# Patient Record
Sex: Female | Born: 1995 | Race: White | Hispanic: No | Marital: Single | Smoking: Never smoker
Health system: Southern US, Community
[De-identification: ages and names within clinical notes are randomized; demographics above are authoritative.]

---
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
## 2016-04-24 ENCOUNTER — Encounter: Payer: Self-pay | Admitting: Emergency Medicine

## 2016-04-24 DIAGNOSIS — J020 Streptococcal pharyngitis: Secondary | ICD-10-CM | POA: Diagnosis not present

## 2016-04-24 DIAGNOSIS — J029 Acute pharyngitis, unspecified: Secondary | ICD-10-CM | POA: Diagnosis present

## 2016-04-24 MED ORDER — ACETAMINOPHEN 325 MG PO TABS
ORAL_TABLET | ORAL | Status: AC
  Filled 2016-04-24: qty 2

## 2016-04-24 MED ORDER — ACETAMINOPHEN 325 MG PO TABS
650.0000 mg | ORAL_TABLET | Freq: Once | ORAL | Status: AC
  Administered 2016-04-24: 650 mg via ORAL

## 2016-04-24 NOTE — ED Triage Notes (Addendum)
Pt presents to ED with sore throat, headache, generalized body aches, fever, and vomiting X1 since Monday. Seen by campus health clinic and was tested for mono and flu; both tests were negative. Seen by urgent care today and tested for strep. antibiotics provided to tx for bacterial infection as well as for possible strep even though strep test was negative. Pt alert and tearful. States that her throat hurts so badly that it feels like "it is closing in". Fever improved today and headache has worsened. Pt reports she has not been able to eat much and thinks she may be dehydrated.

## 2016-04-25 ENCOUNTER — Emergency Department
Admission: EM | Admit: 2016-04-25 | Discharge: 2016-04-25 | Disposition: A | Discharge status: Home / Self Care | Attending: Emergency Medicine | Admitting: Emergency Medicine

## 2016-04-25 DIAGNOSIS — J020 Streptococcal pharyngitis: Secondary | ICD-10-CM

## 2016-04-25 MED ORDER — DEXAMETHASONE SODIUM PHOSPHATE 10 MG/ML IJ SOLN
10.0000 mg | Freq: Once | INTRAMUSCULAR | Status: AC
  Administered 2016-04-25: 10 mg via INTRAVENOUS
  Filled 2016-04-25: qty 1

## 2016-04-25 MED ORDER — IBUPROFEN 100 MG/5ML PO SUSP
ORAL | Status: AC
  Administered 2016-04-25: 600 mg via ORAL
  Filled 2016-04-25: qty 30

## 2016-04-25 MED ORDER — MECLIZINE HCL 25 MG PO TABS
ORAL_TABLET | ORAL | Status: AC
  Filled 2016-04-25: qty 1

## 2016-04-25 MED ORDER — LIDOCAINE VISCOUS 2 % MT SOLN
30.0000 mL | Freq: Once | OROMUCOSAL | Status: AC
  Administered 2016-04-25: 30 mL via OROMUCOSAL
  Filled 2016-04-25: qty 30

## 2016-04-25 MED ORDER — SODIUM CHLORIDE 0.9 % IV BOLUS (SEPSIS)
1000.0000 mL | Freq: Once | INTRAVENOUS | Status: AC
  Administered 2016-04-25: 1000 mL via INTRAVENOUS

## 2016-04-25 MED ORDER — IBUPROFEN 100 MG/5ML PO SUSP
600.0000 mg | Freq: Once | ORAL | Status: AC
  Administered 2016-04-25: 600 mg via ORAL

## 2016-04-25 NOTE — Discharge Instructions (Addendum)
The normal course of your illnesses to be sick for about 4-5 days. Please make sure you remain well-hydrated. Does not important to have food but it is very important to make sure that you have water and other liquids.  It was a pleasure to take care of you today, and thank you for coming to our emergency department.  If you have any questions or concerns before leaving please ask the nurse to grab me and I'm more than happy to go through your aftercare instructions again.  If you were prescribed any opioid pain medication today such as Norco, Vicodin, Percocet, morphine, hydrocodone, or oxycodone please make sure you do not drive when you are taking this medication as it can alter your ability to drive safely.  If you have any concerns once you are home that you are not improving or are in fact getting worse before you can make it to your follow-up appointment, please do not hesitate to call 911 and come back for further evaluation.  Merrily BrittleNeil Jhamal Plucinski MD

## 2016-04-25 NOTE — ED Provider Notes (Signed)
[facility] Emergency Department Provider Note  ____________________________________________   First MD Initiated Contact with Patient 04/25/16 0015     (approximate)  I have reviewed the triage vital signs and the nursing notes.   HISTORY  Chief Complaint Sore Throat; Fever; Generalized Body Aches; and Headache    HPI Ashley Raymond is a 21 y.o. female who comes to the emergency department with 3 days of sore throat. It all began on Monday and she [facility]. She had blood work done and was told she did not have influenza and she did not have mononucleosis. She was then emailed the next day saying that her culture came back in she had an unknown bacterial infection her throat. The provider then called in a prescription for azithromycin. Her symptoms persisted so she went to a different urgent care today. She was told to stop the azithromycin and was begun on amoxicillin. She does report continued fevers at home that respond to Tylenol. She has odynophagia with solids but is able to keep down liquids. She has no past medical history normally takes no medications.   History reviewed. No pertinent past medical history.  There are no active problems to display for this patient.   History reviewed. No pertinent surgical history.  Prior to Admission medications   Not on File    Allergies Patient has no known allergies.  No family history on file.  Social History Social History  Substance Use Topics  . Smoking status: Never Smoker  . Smokeless tobacco: Never Used  . Alcohol use Yes    Review of Systems Constitutional: Positive fevers Eyes: No visual changes. ENT: Positive sore throat. Cardiovascular: Denies chest pain. Respiratory: Denies shortness of breath. Gastrointestinal: No abdominal pain.  No nausea, no vomiting.  No diarrhea.  No constipation. Genitourinary: Negative for dysuria. Musculoskeletal: Negative for back  pain. Skin: Negative for rash. Neurological: Negative for headaches, focal weakness or numbness.  10-point ROS otherwise negative.  ____________________________________________   PHYSICAL EXAM:  VITAL SIGNS: ED Triage Vitals [04/24/16 2041]  Enc Vitals Group     BP 115/66     Pulse Rate (!) 126     Resp 20     Temp (!) 100.6 F (38.1 C)     Temp Source Oral     SpO2 100 %     Weight 135 lb (61.2 kg)     Height 5\' 8"  (1.727 m)     Head Circumference      Peak Flow      Pain Score 9     Pain Loc      Pain Edu?      Excl. in GC?     Constitutional: Alert and oriented x 4 In full clear sentences essentially normal voiced no drooling no respiratory distress Eyes: PERRL EOMI. Head: Atraumatic. Nose: No congestion/rhinnorhea. Mouth/Throat: No trismus uvula midline bilateral pharyngeal erythema with exudate no peritonsillar fullness no submandibular or sublingual fullness she does have bilateral cervical lymphadenopathy Neck: No stridor.   Cardiovascular: Tachycardic rate, regular rhythm. Grossly normal heart sounds.  Good peripheral circulation. Respiratory: Normal respiratory effort.  No retractions. Lungs CTAB and moving good air Gastrointestinal: Soft nondistended nontender no rebound no guarding no peritonitis no McBurney's tenderness negative Rovsing's no costovertebral tenderness negative Murphy's Musculoskeletal: No lower extremity edema   Neurologic:  Normal speech and language. No gross focal neurologic deficits are appreciated. Skin:  Skin is warm, dry and intact. No rash  noted. Psychiatric: Mood and affect are normal. Speech and behavior are normal.    ____________________________________________   DIFFERENTIAL  Strep pharyngitis, viral pharyngitis, influenza, deep space infection, peritonsillar abscess ____________________________________________   LABS (all labs ordered are listed, but only abnormal results are displayed)  Labs Reviewed - No data to  display   __________________________________________  EKG   ____________________________________________  RADIOLOGY   ____________________________________________   PROCEDURES  Procedure(s) performed: no  Procedures  Critical Care performed: no  ____________________________________________   INITIAL IMPRESSION / ASSESSMENT AND PLAN / ED COURSE  Pertinent labs & imaging results that were available during my care of the patient were reviewed by me and considered in my medical decision making (see chart for details).  Presumptively the patient's ture came back and has no airway compromise. I will give her an IV fluid bolus, Decadron, liquid Motrin, and reevaluate. Anticipate discharge home.   After fluids Motrin and steroids the patient feels improved. I had a lengthy discussion with the patient regarding the clinical course that is to be expected with strep pharyngitis and that antibiotics would likely only decrease the duration of her illness by 12-16 hours. She is alert he started to think it is reasonable to continue taking the antibiotics. She is advised to return immediately if she is short of breath or cannot eat or drink. She is able to swallow water before discharge and is discharged home in improved condition. ____________________________________________   FINAL CLINICAL IMPRESSION(S) / ED DIAGNOSES  Final diagnoses:  Strep pharyngitis      NEW MEDICATIONS STARTED DURING THIS VISIT:  There are no discharge medications for this patient.    Note:  This document was prepared using Dragon voice recognition software and may include unintentional dictation errors.     Merrily Brittle, MD 04/25/16 2235

## 2017-01-15 ENCOUNTER — Ambulatory Visit: Attending: Nurse Practitioner | Admitting: Physical Therapy

## 2017-01-15 DIAGNOSIS — M25552 Pain in left hip: Secondary | ICD-10-CM | POA: Diagnosis not present

## 2017-01-15 NOTE — Therapy (Signed)
[facility] PHYSICAL AND SPORTS MEDICINE [address] [place], [place], [address] Phone: [phone]   Fax:  [phone]  Physical Therapy Evaluation  Patient Details  Name: Ashley Raymond Deprey MRN: [id number] Date of Birth: 11/30/2003 Referring Provider: Viviano SimasSarah Parker   Encounter Date: 01/15/2017  PT End of Session - 01/15/17 1111    Visit Number  1    Number of Visits  9    Date for PT Re-Evaluation  03/26/17    PT Start Time  1000    PT Stop Time  1105    PT Time Calculation (min)  65 min    Activity Tolerance  Patient tolerated treatment well    Behavior During Therapy  [facility] for tasks assessed/performed       No past medical history on file.  No past surgical history on file.  There were no vitals filed for this visit.   Subjective Assessment - 01/15/17 1137    Subjective  Patient reports roughly 1 and 1/2 months ago she began developing L lower back/gluteal pain after repeated [place] at  [employer]. It was initially constant, but had eased until October when she was rucking long distances, she would get severe pain to the point she had difficulty walking the next day. She went to see an Event organiserathletic trainer, who used modalities which helped transiently. She has been taking Naproxen, which has really helped. She reports she has a constant annoyance, which increases the next day after vigorous activity. She has stopped running/rucking for the past month. Denies numbness/tingling now, but did have some initially on lateral L hip through IT band region. She has tried stretching, but has not been consistent. 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     Diagnostic tests  None at this point     Patient Stated Goals  To be able to return to her PT activities.     Currently in Pain?  Yes    Pain Score  -- Annoyance in her posterior L hip    Pain Location  Buttocks    Pain Orientation  Left    Pain Descriptors / Indicators  Aching    Pain  Type  Acute pain    Pain Onset  More than a month ago    Pain Frequency  Intermittent    Aggravating Factors   Physical activity, running     Pain Relieving Factors  Naproxen, rest          [facility] PT Assessment - 01/15/17 1130      Assessment   Medical Diagnosis  Low back pain    Referring Provider  Viviano SimasSarah Parker    Onset Date/Surgical Date  -- Late summer/fall    Prior Therapy  -- E-stim/heat      Precautions   Precautions  None      Restrictions   Weight Bearing Restrictions  No      Balance Screen   Has the patient fallen in the past 6 months  No    Has the patient had a decrease in activity level because of a fear of falling?   Yes    Is the patient reluctant to leave their home because of a fear of falling?   No      Home Public house managernvironment   Living Environment  Private residence      Prior Function   Level of Independence  Independent    Chartered certified accountantVocation  Student    Vocation Requirements  --  In ROTC     Leisure  Likes to run/hike      Cognition   Overall Cognitive Status  Within Functional Limits for tasks assessed      Observation/Other Assessments   Observations  RLE IR at rest      Sensation   Light Touch  Appears Intact      Palpation   Spinal mobility  No pain with CPAs    Palpation comment  Pain in and around superior L gluteal area by SIJ on L      FABER test   findings  Positive    Side  LEft    Comment  -- Very mild discomfort noted      Slump test   Findings  Negative    Side  Left      Prone Knee Bend Test   Findings  Negative      Straight Leg Raise   Findings  Positive    Side   Left    Comment  -- At roughly 30-45 degrees in buttock only      Pelvic Dictraction   Findings  Negative      Pelvic Compression   Findings  Negative      Sacral thrust    Findings  Positive    Side  Left    Comments  -- Thigh thrust test positive bilaterally      Gaenslen's test   Findings  Positive    Side   Left      Sacral Compression   Findings   Negative      Hip Scouring   Findings  Negative      other   Findings  Negative    Side  -- both    Comments  Ely's Test      Standing trunk extensions- annoyance on L side Standing trunk flexion - to ankles with no discomfort noted   Standing rotations - mild discomfort to L side, none on R side   Squatting - some ache/discomfort after several squats, no obvious pronation, though mild shifting of weight to RLE during descent, otherwise no deficits in squat mechanics   SIngle leg sit to stands- noted to have decreased balance and increased valgus on L side relative to R side.  Reports stepping into pants has been painful.   Slump test - negative bilaterally   MMT 5/5 at hips and knees, mild discomfort with R IR/ER in her L buttock.   TherEx/Interventions  Supine isometric clamshells x 10 for 10" holds for 2 sets (reported no change in SLR or scour)  Grade IV mobilizations over gluteal region, extending near L PSIS x 5 bouts x 30-45" per bout (reported substantial decrease in SLR pain, scour discomfort was unchanged)  Isometric hip extensions in supine x 10 for 10" holds   **educated patient on standing hip abductions, standing hip extensions as part of her HEP  Observed single leg bridging on her LLE x 8 repetitions which were mildly uncomfortable for her, but challenging       Objective measurements completed on examination: See above findings.              PT Education - 01/15/17 1136    Education provided  Yes    Education Details  Keep pain with exercises in 1-2/10 level, difficult to differentiate currently whether muscular vs joint, though leaning towards joint    Person(s) Educated  Patient    Methods  Explanation;Demonstration;Verbal cues;Handout  Comprehension  Returned demonstration;Verbalized understanding          PT Long Term Goals - 01/15/17 1114      PT LONG TERM GOAL #1   Title  Patient will be able to run at least 3 miles with  no increase in pain to return to ADLs.     Time  8    Period  Weeks    Status  New    Target Date  03/12/17      PT LONG TERM GOAL #2   Title  Patient will ruck at least 10 miles with no increase in pain to return to duty related demands.     Time  8    Period  Weeks    Status  New    Target Date  03/12/17      PT LONG TERM GOAL #3   Title  Patient will be able to complete morning physical training sessions with no more than 1/10 pain to demonstrate improved tolerance for duty demands.     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 She has been having L buttock pain since she attended [employer] school this summer. She really began to notice it once she started rucking in October, now gets pain 1st thing in the morning and with activity the next day. She tests positive on 2/5 of the SIJ cluster, but her aggravating signs today seem more consistent with an injury to the posterior hip musculature complex and residual intolerance to loading. She reports pain with repeated squats, SLR, side lying clamshells against resistance, and repeated single leg bridging, which seems more consistent with muscle pathology vs joint based pathology. As such she would likely benefit from soft tissue work in conjuction with gradual loading progression to allow her to return to running/duty related activities.     Clinical Presentation  Stable    Clinical Decision Making  Moderate    Rehab Potential  Excellent    PT Frequency  2x / week    PT Duration  4 weeks    PT Treatment/Interventions  Iontophoresis 4mg /ml Dexamethasone;Gait training;Balance training;Neuromuscular re-education;Dry needling;Manual techniques;Therapeutic activities;Therapeutic exercise;Electrical Stimulation;Cryotherapy;Ultrasound;Biofeedback;Moist Heat;Taping     PT Next Visit Plan  Continue to assess for SIJ pathology vs gluteal muscle loading issues.    PT Home Exercise Plan  Soft tissue mobilizations with tennis ball, isometric supine clamshells, single leg bridging, standing hip abductions and extensions     Consulted and Agree with Plan of Care  Patient       Patient will benefit from skilled therapeutic intervention in order to improve the following deficits and impairments:  Pain, Decreased strength, Decreased endurance, Decreased activity tolerance, Difficulty walking, Increased muscle spasms  Visit Diagnosis: Pain in left hip - Plan: PT plan of care cert/re-cert     Problem List There are no active problems to display for this patient.  Alva GarnetPatrick McNamara PT, DPT, CSCS    01/15/2017, 11:46 AM  [facility] [address] [place], [place], [address] Phone: [phone]   Fax:  [phone]  Name: Ashley Raymond MRN: [id number] Date of Birth: 11/16/1995

## 2017-01-17 ENCOUNTER — Ambulatory Visit: Admitting: Physical Therapy

## 2017-01-17 DIAGNOSIS — M25552 Pain in left hip: Secondary | ICD-10-CM | POA: Diagnosis not present

## 2017-01-17 NOTE — Patient Instructions (Signed)
SLR on L side - negative   Mild soreness/discomfort with single leg bridges   FABER, FADIR, IR/ER - no pain/discomfort in her LLE   Walking as part of warmup x 2 minutes, 5.5 mph x 2 minutes, 6.5 mph   Hip thrusts   Lunges  SLDL on blue side up of BOSU with 3# x 10 repetitions   Single leg sit to stands   Deadlifts with 20# x 10, 40# x 10 (no pain, cuing to keep chin tucked into neutral)   Standing trunk extension - not sore, just an awareness.

## 2017-01-17 NOTE — Therapy (Addendum)
[facility] SPORTS MEDICINE [address] [place], [place], [address] Phone: [phone]   Fax:  [phone]  Physical Therapy Treatment  Patient Details  Name: Ashley Raymond MRN: [id number] Date of Birth: 03/03/1995 Referring Provider: Viviano SimasSarah Parker   Encounter Date: 01/17/2017  PT End of Session - 01/17/17 2213    Visit Number  2    Number of Visits  9    Date for PT Re-Evaluation  03/26/17    PT Start Time  1005    PT Stop Time  1045    PT Time Calculation (min)  40 min    Activity Tolerance  Patient tolerated treatment well    Behavior During Therapy  [facility] for tasks assessed/performed       No past medical history on file.  No past surgical history on file.  There were no vitals filed for this visit.  Subjective Assessment - 01/17/17 2211    Subjective  Patient reports she had mild discomfort yesterday morning, but has had significant resolution of her initial presenting symptoms.     Limitations  -- Running    Diagnostic tests  None at this point     Patient Stated Goals  To be able to return to her PT activities.     Currently in Pain?  No/denies       SLR on L side - negative   Mild soreness/discomfort with single leg bridges   FABER, FADIR, IR/ER - no pain/discomfort in her LLE   Walking as part of warmup x 2 minutes, 5.5 mph x 2 minutes, 6.5 mph -- x 1 minute -- patient reported no discomfort while jogging   Hip thrusts on black mat table x 10 with bilateral LEs, with 1LE x 8 per side (quite challenging, but not painful)  Lunges x 12 per side for 2 sets   SLDL on blue side up of BOSU with 3# x 10 repetitions   Single leg sit to stands -- x 8 repetitions per side for 1 set (continues to demonstrate decreased balance and valgus bilaterally    Deadlifts with 20# x 10, 40# x 10 (no pain, cuing to keep chin tucked into neutral)   Standing trunk extension - not sore, just an awareness.                         PT Education - 01/17/17 2212    Education provided  Yes    Education Details  Progression of HEP and progression of therapy in follow ups.     Person(s) Educated  Patient    Methods  Explanation;Demonstration;Handout;Verbal cues    Comprehension  Returned demonstration;Verbalized understanding          PT Long Term Goals - 01/15/17 1114      PT LONG TERM GOAL #1   Title  Patient will be able to run at least 3 miles with no increase in pain to return to ADLs.     Time  8    Period  Weeks    Status  New    Target Date  03/12/17      PT LONG TERM GOAL #2   Title  Patient will ruck at least 10 miles with no increase in pain to return to duty related demands.     Time  8    Period  Weeks    Status  New    Target Date  03/12/17  PT LONG TERM GOAL #3   Title  Patient will be able to complete morning physical training sessions with no more than 1/10 pain to demonstrate improved tolerance for duty demands.     Time  8    Period  Weeks    Status  New    Target Date  03/12/17            Plan - 01/17/17 2213    Clinical Impression Statement  Patient has had excellent response to therapy thus far and was able to jog for a short period of time on the treadmill with no exacerbation this date. She has not had pain since previous session, and was able to tolerate progression in ther-ex this date, will continue to monitor for any symptom exacerbation.     Clinical Presentation  Stable    Clinical Decision Making  Moderate    Rehab Potential  Excellent    PT Frequency  2x / week    PT Duration  4 weeks    PT Treatment/Interventions  Iontophoresis 4mg /ml Dexamethasone;Gait training;Balance training;Neuromuscular re-education;Dry needling;Manual techniques;Therapeutic activities;Therapeutic exercise;Electrical Stimulation;Cryotherapy;Ultrasound;Biofeedback;Moist Heat;Taping    PT Next Visit Plan  Continue to assess for SIJ pathology vs  gluteal muscle loading issues.    PT Home Exercise Plan  Soft tissue mobilizations with tennis ball, isometric supine clamshells, single leg bridging, standing hip abductions and extensions     Consulted and Agree with Plan of Care  Patient       Patient will benefit from skilled therapeutic intervention in order to improve the following deficits and impairments:  Pain, Decreased strength, Decreased endurance, Decreased activity tolerance, Difficulty walking, Increased muscle spasms  Visit Diagnosis: Pain in left hip     Problem List There are no active problems to display for this patient.  Alva GarnetPatrick Angeli Demilio PT, DPT, CSCS    Late addendum - exercise modifications   01/17/2017, 10:15 PM  [facility]INE [address] [place], [place], [address] Phone: [phone]   Fax:  [phone]  Name: Ashley Hamperngela Wiers MRN: [id number] Date of Birth: 02/17/1995

## 2017-01-20 ENCOUNTER — Ambulatory Visit: Admitting: Physical Therapy

## 2017-01-21 ENCOUNTER — Ambulatory Visit: Admitting: Physical Therapy

## 2017-01-21 DIAGNOSIS — M25552 Pain in left hip: Secondary | ICD-10-CM | POA: Diagnosis not present

## 2017-01-21 NOTE — Patient Instructions (Addendum)
SLR - not sore, but can feel it on LLE, nothing on RLE   Hip thrust - painful in posterior musculature   Joint mobilizations over L distal sacral joints/soft tissue x 6 bouts x 60"   Hip IR/ER, FADIR/FABER - all negative on LLE   Soft tissue mobilizations with soft ball   Jogging assessment on treadmill at 5. for 2 minutes, 6.5 mph x 3 minutes with no pain reported by patient   Initially band resisted heidens were uncomfortable, opted to use TM handles for 2 sets x 5 repetitions, repeated band resisted heidens x 5 repetitions for 2 sets

## 2017-01-23 ENCOUNTER — Ambulatory Visit: Admitting: Physical Therapy

## 2017-01-23 DIAGNOSIS — M25552 Pain in left hip: Secondary | ICD-10-CM | POA: Diagnosis not present

## 2017-01-29 NOTE — Therapy (Signed)
[place] [facility] [address] , [place], [address] Phone: [phone]   Fax:  [phone]  Physical Therapy Treatment  Patient Details  Name: Ashley Raymond MRN: [id number] Date of Birth: 06/06/1995 Referring Provider: Viviano SimasSarah Parker   Encounter Date: 01/23/2017  PT End of Session - 01/29/17 1435    Visit Number  4    Number of Visits  9    Date for PT Re-Evaluation  03/26/17    PT Start Time  1550    PT Stop Time  1648    PT Time Calculation (min)  58 min    Activity Tolerance  Patient tolerated treatment well    Behavior During Therapy  [facility] for tasks assessed/performed       No past medical history on file.  No past surgical history on file.  There were no vitals filed for this visit.  Subjective Assessment - 01/29/17 1439    Subjective  Patient reports she is continuing to get some discomfort in her posterior L hip/buttock after jogging in previous sessions. It is tolerable, but she notes it is still present (manual therapy interventions appears to havebeen beneficial).     Limitations  -- Running    Diagnostic tests  None at this point     Patient Stated Goals  To be able to return to her PT activities.     Currently in Pain?  No/denies    Pain Onset  More than a month ago       Long axis distraction x 5 bouts on LLE grade IV mobilizations for 2 separate bouts, which she reports is beneficial and  Is not increasing her pain level. Hip gapping mobilization with belt grade IV mobilizations for 5 bouts for 30-45" per bout, with no increase in discomfort reported by patient.   Inferior glides with hip flexed using belt mobilizations x 5 bouts x 30-45" per bout with relief of resting pain/symptoms reported.   Trigger point dry needling to superior L glute max (unbilled) with 1 local twitch response noted. Educated patient on risks and benefits of TDN as well as precautions and potential side effects. Patient  tolerated well, reporting decreased pain after completion.   Soft tissue mobilization performed over L superior glute max -- patient reported significant reduction if not complete reduction of pain symptoms she initially presented with this date in clinic. Will continue with HEP and discontinue running protocol until she is able to tolerate better than having continued flare ups.                        PT Education - 01/29/17 1438    Education provided  Yes    Education Details  Continue to follow up with therapist given her response to manual therapy this date, gradually re-introduce running.     Person(s) Educated  Patient    Methods  Explanation;Demonstration    Comprehension  Verbalized understanding;Returned demonstration          PT Long Term Goals - 01/15/17 1114      PT LONG TERM GOAL #1   Title  Patient will be able to run at least 3 miles with no increase in pain to return to ADLs.     Time  8    Period  Weeks    Status  New    Target Date  03/12/17      PT LONG TERM GOAL #2  Title  Patient will ruck at least 10 miles with no increase in pain to return to duty related demands.     Time  8    Period  Weeks    Status  New    Target Date  03/12/17      PT LONG TERM GOAL #3   Title  Patient will be able to complete morning physical training sessions with no more than 1/10 pain to demonstrate improved tolerance for duty demands.     Time  8    Period  Weeks    Status  New    Target Date  03/12/17            Plan - 01/29/17 1435    Clinical Impression Statement  Patient responds quite well to manual therapy interventions provided this date (including trigger point dry needling, joint mobilizations, and soft tissue mobilization) to her L hip. She would likely benefit from continued strengthening of posterior hip musculature as tolerated to allow her to tolerate the loads/demands of running.     Clinical Presentation  Stable    Clinical  Decision Making  Moderate    Rehab Potential  Excellent    PT Frequency  2x / week    PT Duration  4 weeks    PT Treatment/Interventions  Iontophoresis 4mg /ml Dexamethasone;Gait training;Balance training;Neuromuscular re-education;Dry needling;Manual techniques;Therapeutic activities;Therapeutic exercise;Electrical Stimulation;Cryotherapy;Ultrasound;Biofeedback;Moist Heat;Taping    PT Next Visit Plan  Continue to assess for SIJ pathology vs gluteal muscle loading issues.    PT Home Exercise Plan  Soft tissue mobilizations with tennis ball, isometric supine clamshells, single leg bridging, standing hip abductions and extensions     Consulted and Agree with Plan of Care  Patient       Patient will benefit from skilled therapeutic intervention in order to improve the following deficits and impairments:  Pain, Decreased strength, Decreased endurance, Decreased activity tolerance, Difficulty walking, Increased muscle spasms  Visit Diagnosis: Pain in left hip     Problem List There are no active problems to display for this patient.  Alva GarnetPatrick McNamara PT, DPT, CSCS    01/29/2017, 2:41 PM  [place] [facility] [address] [place], [place], [address] Phone: [phone]   Fax:  [phone]  Name: Ashley Raymond MRN: [id number] Date of Birth: 06/14/1995

## 2017-01-29 NOTE — Therapy (Signed)
[place] [facility] [address] [place], [place], [address] Phone: [phone]   Fax:  [phone]  Physical Therapy Treatment  Patient Details  Name: Ashley Raymond MRN: [id number] Date of Birth: 12/13/1995 Referring Provider: Viviano SimasSarah Parker   Encounter Date: 01/21/2017  PT End of Session - 01/29/17 1428    Visit Number  3    Number of Visits  9    Date for PT Re-Evaluation  03/26/17    PT Start Time  1632    PT Stop Time  1714    PT Time Calculation (min)  42 min    Activity Tolerance  Patient tolerated treatment well    Behavior During Therapy  [facility] for [facility] assessed/per[facility]       No past medical history on file.  No past surgical history on file.  There were no vitals filed for this visit.  Subjective Assessment - 01/29/17 1431    Subjective  Patient reports after jogging the other day she felt fine, however her L buttock/posterior thigh has bothered her similarly to when she first began having symptoms. This has since dissipated, and is now more of an annoyance.     Limitations  -- Running    Diagnostic tests  None at this point     Patient Stated Goals  To be able to return to her PT activities.     Currently in Pain?  Yes    Pain Score  -- Reports more of an annoyance in her posterior L hip/buttock.    Pain Location  Buttocks    Pain Orientation  Left    Pain Descriptors / Indicators  Aching    Pain Type  Chronic pain    Pain Onset  More than a month ago    Pain Frequency  Intermittent       SLR - not sore, but can feel it on LLE, nothing on RLE   Hip thrust - painful in posterior musculature   Joint mobilizations over L distal sacral joints/soft tissue x 6 bouts x 60" -- patient reported decrease in resting level of discomfort after completion.   Hip IR/ER, FADIR/FABER - all negative on LLE   Soft tissue mobilizations with soft ball (educated on how to complete at home)   Jogging assessment on  treadmill at 5.5mph for 2 minutes, 6.5 mph x 3 minutes with no pain reported by patient   Initially band resisted heidens were uncomfortable, opted to use TM handles for 2 sets x 5 repetitions, repeated band resisted heidens x 5 repetitions for 2 sets (added to her HEP)                        PT Education - 01/29/17 1430    Education provided  Yes    Education Details  Follow up instructions, use of lax ball/tennis ball for soft tissue mobilization.     Person(s) Educated  Patient    Methods  Explanation;Demonstration;Verbal cues    Comprehension  Verbalized understanding;Returned demonstration;Verbal cues required          PT Long Term Goals - 01/15/17 1114      PT LONG TERM GOAL #1   Title  Patient will be able to run at least 3 miles with no increase in pain to return to ADLs.     Time  8    Period  Weeks    Status  New    Target  Date  03/12/17      PT LONG TERM GOAL #2   Title  Patient will ruck at least 10 miles with no increase in pain to return to duty related demands.     Time  8    Period  Weeks    Status  New    Target Date  03/12/17      PT LONG TERM GOAL #3   Title  Patient will be able to complete morning physical training sessions with no more than 1/10 pain to demonstrate improved tolerance for duty demands.     Time  8    Period  Weeks    Status  New    Target Date  03/12/17            Plan - 01/29/17 1428    Clinical Impression Statement  Patient reports anticipated muscular soreness, consistent with DOMS after initial progression of ther-ex. No increase in symptoms from baseline, though she reports feeling roughly back to baseline after running (pain in posterior L buttock area). In follow up session, may consider hip joint mobilizations for more prolonged relief.    Clinical Presentation  Stable    Clinical Decision Making  Moderate    Rehab Potential  Excellent    PT Frequency  2x / week    PT Duration  4 weeks    PT  Treatment/Interventions  Iontophoresis 4mg /ml Dexamethasone;Gait training;Balance training;Neuromuscular re-education;Dry needling;Manual techniques;Therapeutic activities;Therapeutic exercise;Electrical Stimulation;Cryotherapy;Ultrasound;Biofeedback;Moist Heat;Taping    PT Next Visit Plan  Continue to assess for SIJ pathology vs gluteal muscle loading issues.    PT Home Exercise Plan  Soft tissue mobilizations with tennis ball, isometric supine clamshells, single leg bridging, standing hip abductions and extensions     Consulted and Agree with Plan of Care  Patient       Patient will benefit from skilled therapeutic intervention in order to improve the following deficits and impairments:  Pain, Decreased strength, Decreased endurance, Decreased activity tolerance, Difficulty walking, Increased muscle spasms  Visit Diagnosis: Pain in left hip     Problem List There are no active problems to display for this patient.  Alva GarnetPatrick McNamara PT, DPT, CSCS     01/29/2017, 2:32 PM  [place] [facility] [address] [place], [place], [address] Phone: [phone]   Fax:  [phone]  Name: Ashley Raymond MRN: [id number] Date of Birth: 03/09/1995

## 2017-02-12 ENCOUNTER — Ambulatory Visit: Attending: Nurse Practitioner | Admitting: Physical Therapy

## 2017-02-12 DIAGNOSIS — M25552 Pain in left hip: Secondary | ICD-10-CM | POA: Diagnosis present

## 2017-02-12 NOTE — Therapy (Signed)
[place] [facility] [address], [place], [address] Phone: [phone]   Fax:  [phone]  Physical Therapy Treatment  Patient Details  Name: Tienna Bienkowski MRN: [id number] Date of Birth: April 06, 1995 Referring Provider: Viviano Simas   Encounter Date: 02/12/2017  PT End of Session - 02/12/17 0846    Visit Number  5    Number of Visits  9    Date for PT Re-Evaluation  03/26/17    PT Start Time  0813    PT Stop Time  0900    PT Time Calculation (min)  47 min    Activity Tolerance  Patient tolerated treatment well    Behavior During Therapy  [facility] for tasks assessed/performed       No past medical history on file.  No past surgical history on file.  There were no vitals filed for this visit.  Subjective Assessment - 02/12/17 0813    Subjective  Patient reports her low back began hurting the other day, unsure if it was because she slept on a new bed or the long drive from home, or moving her friends in that she wasn't doing at home. She has been able to perform her HEP throughout her time at home.     Limitations  -- Running    Diagnostic tests  None at this point     Patient Stated Goals  To be able to return to her PT activities.     Currently in Pain?  Yes    Pain Score  2     Pain Location  Buttocks    Pain Orientation  Left    Pain Descriptors / Indicators  Aching    Pain Type  Chronic pain    Pain Onset  More than a month ago    Pain Frequency  Intermittent       Long axis traction on LLE x 5 bouts for 30-45" bouts grade III++ mobilizations which were well tolerated  Hip gapping and superior - inferior joint mobs x 3 bouts x 30" per bout with belt assistance grade IV++ mobilizations which were well tolerated  Hip gapping mobs x 3 bouts x 30" per bout with belt assistance grade IV++ mobilizations which were well tolerated  Walking on TM x 3 minutes for warm up then 4.5 minutes at 5.5 mph with video  recording -- no obvious gait abnormalities or asymmetries notes side to side   SLR- positive at 45 degrees on LLE  L Thigh thrust - positive  Gaenslens positive on LLE   No pain with CPAs throughout lumbar spine   Supine single leg bridging with hip abduction was mildly painful with RLE down, not LLE down  Isometric hip flexion on LLE (muscle energy technique) x 10 repetitions for 5" holds x 3 bouts, mild decrease in discomfort noted                  PT Education - 02/12/17 1322    Education provided  Yes    Education Details  Follow up with therapist about running tomorrow, may consider a pelvic therapist.     Person(s) Educated  Patient    Methods  Explanation;Demonstration    Comprehension  Verbalized understanding;Returned demonstration          PT Long Term Goals - 01/15/17 1114      PT LONG TERM GOAL #1   Title  Patient will be able to run at least 3  miles with no increase in pain to return to ADLs.     Time  8    Period  Weeks    Status  New    Target Date  03/12/17      PT LONG TERM GOAL #2   Title  Patient will ruck at least 10 miles with no increase in pain to return to duty related demands.     Time  8    Period  Weeks    Status  New    Target Date  03/12/17      PT LONG TERM GOAL #3   Title  Patient will be able to complete morning physical training sessions with no more than 1/10 pain to demonstrate improved tolerance for duty demands.     Time  8    Period  Weeks    Status  New    Target Date  03/12/17            Plan - 02/12/17 0846    Clinical Impression Statement  Patient continues to have residual pain in her L SIJ area, which has decreased at rest since starting therapy, but continues to present with SIJ joint testing positive and pain with jogging. No biomechanical flaws are obvious on video recording during jogging. Will consider referral to a pelvic therapist if she continues to have discomfort that is not managed with joint  mobilizations and hip strengthening.     Clinical Presentation  Stable    Clinical Decision Making  Moderate    Rehab Potential  Excellent    PT Frequency  2x / week    PT Duration  4 weeks    PT Treatment/Interventions  Iontophoresis 4mg /ml Dexamethasone;Gait training;Balance training;Neuromuscular re-education;Dry needling;Manual techniques;Therapeutic activities;Therapeutic exercise;Electrical Stimulation;Cryotherapy;Ultrasound;Biofeedback;Moist Heat;Taping    PT Next Visit Plan  Continue to assess for SIJ pathology vs gluteal muscle loading issues.    PT Home Exercise Plan  Soft tissue mobilizations with tennis ball, isometric supine clamshells, single leg bridging, standing hip abductions and extensions     Consulted and Agree with Plan of Care  Patient       Patient will benefit from skilled therapeutic intervention in order to improve the following deficits and impairments:  Pain, Decreased strength, Decreased endurance, Decreased activity tolerance, Difficulty walking, Increased muscle spasms  Visit Diagnosis: Pain in left hip     Problem List There are no active problems to display for this patient.  Alva GarnetPatrick McNamara PT, DPT, CSCS    02/12/2017, 1:30 PM  [place] [facility] [address] [place], [place], [address] Phone: [phone]   Fax:  [phone]  Name: Baron Hamperngela Shober MRN: [id number] Date of Birth: 12/16/1995

## 2017-02-18 ENCOUNTER — Ambulatory Visit

## 2017-02-18 DIAGNOSIS — M25552 Pain in left hip: Secondary | ICD-10-CM | POA: Diagnosis not present

## 2017-02-18 NOTE — Therapy (Signed)
[place] [facility] [address] [place], [place], [address] Phone: [phone]   Fax:  [phone]  Physical Therapy Treatment  Patient Details  Name: Ashley Raymond MRN: [id number] Date of Birth: 08/14/1995 Referring Provider: Viviano SimasSarah Parker   Encounter Date: 02/18/2017  PT End of Session - 02/18/17 0830    Visit Number  6    Number of Visits  9    Date for PT Re-Evaluation  03/26/17    PT Start Time  0730    PT Stop Time  0815    PT Time Calculation (min)  45 min    Activity Tolerance  Patient tolerated treatment well    Behavior During Therapy  [facility] for [facility] assessed/performed       History reviewed. No pertinent past medical history.  History reviewed. No pertinent surgical history.  There were no vitals filed for this visit.  Subjective Assessment - 02/18/17 0805    Subjective  Patient reports increased pain after the previous session secondary to the runnning during the previous session. Patient reports increased pain the day after running.     Limitations  -- Running    Diagnostic tests  None at this point     Patient Stated Goals  To be able to return to her PT activities.     Currently in Pain?  Yes    Pain Score  3     Pain Location  Back    Pain Orientation  Left    Pain Descriptors / Indicators  Aching    Pain Type  Chronic pain    Pain Onset  More than a month ago    Pain Frequency  Intermittent       TREATMENT:  Manual therapy: Joint mobilizations in prone - unilaterally L5-L2  4 x 30sec grade IV; centrally S1-L2 4 x 30sec grade IV noted hypomobility along affected segmental levels; goal to improve mobility and decrease pain.   Therapeutic Exercise: Multifidus crunch in sitting - x 20  Lumbar extension in standing CKC - x 20 Prone press ups - x 20  Walking at treadmill at 7deg angle - x211min; running for 1 min at 5.8 mph to improve mechanics Patient reports no pain at end of session.   PT  Education - 02/18/17 0829    Education provided  Yes    Education Details  HEP: lumbar extension, multifidus crunch    Person(s) Educated  Patient    Methods  Explanation;Demonstration    Comprehension  Verbalized understanding;Returned demonstration          PT Long Term Goals - 01/15/17 1114      PT LONG TERM GOAL #1   Title  Patient will be able to run at least 3 miles with no increase in pain to return to ADLs.     Time  8    Period  Weeks    Status  New    Target Date  03/12/17      PT LONG TERM GOAL #2   Title  Patient will ruck at least 10 miles with no increase in pain to return to duty related demands.     Time  8    Period  Weeks    Status  New    Target Date  03/12/17      PT LONG TERM GOAL #3   Title  Patient will be able to complete morning physical training sessions with no more than 1/10 pain  to demonstrate improved tolerance for duty demands.     Time  8    Period  Weeks    Status  New    Target Date  03/12/17            Plan - 02/18/17 0831    Clinical Impression Statement  Patient demonstrates increased pain with running after the previous session but has decreased pain after performing central and lateral PAs to the lumbar spine and PA pressure to the sacrum/L SIJ. Focused on improving motor control and patient will benefit from further skilled therapy to return to prior level of function.     Rehab Potential  Excellent    PT Frequency  2x / week    PT Duration  4 weeks    PT Treatment/Interventions  Iontophoresis 4mg /ml Dexamethasone;Gait training;Balance training;Neuromuscular re-education;Dry needling;Manual techniques;Therapeutic activities;Therapeutic exercise;Electrical Stimulation;Cryotherapy;Ultrasound;Biofeedback;Moist Heat;Taping    PT Next Visit Plan  Continue to assess for SIJ pathology vs gluteal muscle loading issues.    PT Home Exercise Plan  Soft tissue mobilizations with tennis ball, isometric supine clamshells, single leg bridging,  standing hip abductions and extensions     Consulted and Agree with Plan of Care  Patient       Patient will benefit from skilled therapeutic intervention in order to improve the following deficits and impairments:  Pain, Decreased strength, Decreased endurance, Decreased activity tolerance, Difficulty walking, Increased muscle spasms  Visit Diagnosis: Pain in left hip     Problem List There are no active problems to display for this patient.   Myrene Galas, PT DPT 02/18/2017, 8:36 AM  [place] [facility] PHYSICAL AND SPORTS MEDICINE [address], [place], [address] Phone: [phone]   Fax:  [phone]  Name: Ashley Raymond MRN: [id number] Date of Birth: 1995/04/13

## 2017-02-26 ENCOUNTER — Ambulatory Visit

## 2017-02-26 DIAGNOSIS — M25552 Pain in left hip: Secondary | ICD-10-CM

## 2017-02-26 NOTE — Therapy (Signed)
[place] [facility]INE [address] [place], [address] Phone: [phone]   Fax:  [phone]  Physical Therapy Treatment  Patient Details  Name: Ashley Raymond MRN: [id number] Date of Birth: [birth year] Referring Provider: Viviano Simas   Encounter Date: 02/26/2017  PT End of Session - 02/26/17 1053    Visit Number  7    Number of Visits  9    Date for PT Re-Evaluation  03/26/17    PT Start Time  1000    PT Stop Time  1045    PT Time Calculation (min)  45 min    Activity Tolerance  Patient tolerated treatment well    Behavior During Therapy  [facility] for tasks assessed/performed       History reviewed. No pertinent past medical history.  History reviewed. No pertinent surgical history.  There were no vitals filed for this visit.  Subjective Assessment - 02/26/17 1027    Subjective  Patient reports she had increased pain after running 1 mile but states the pain is a lot less then the last time she tried to run.     Limitations  -- Running    Diagnostic tests  None at this point     Patient Stated Goals  To be able to return to her PT activities.     Currently in Pain?  Yes    Pain Score  3     Pain Location  Back    Pain Orientation  Left    Pain Descriptors / Indicators  Aching    Pain Type  Chronic pain    Pain Onset  More than a month ago    Pain Frequency  Intermittent       TREATMENT:   Manual therapy: Joint mobilizations in prone - unilaterally L5-L2  4 x 30sec grade IV; centrally S1-L2 4 x 30sec grade IV noted hypomobility along affected segmental levels; goal to improve mobility and decrease pain.  Sidelying mobilizations for lumbar mobilizations - 2 x 30sec grade IV stopped due to foot numbness   Therapeutic Exercise: Knee bent in prone hip extension - 2 x 20 Hip abduction in sidelying into slight abduction - 2 x 20 Prone press ups - x 20  Seated SLUMP neuro tension mobs - x 20 Single leg bridges  with 25# weight - 2 x 10 on R LE Dead bugs in hooklying - x 10   Patient reports no pain at end of session.   PT Education - 02/26/17 1052    Education provided  Yes    Education Details  form/technique with exercise; back extension in prone    Person(s) Educated  Patient    Methods  Explanation;Demonstration    Comprehension  Verbalized understanding;Returned demonstration          PT Long Term Goals - 01/15/17 1114      PT LONG TERM GOAL #1   Title  Patient will be able to run at least 3 miles with no increase in pain to return to ADLs.     Time  8    Period  Weeks    Status  New    Target Date  03/12/17      PT LONG TERM GOAL #2   Title  Patient will ruck at least 10 miles with no increase in pain to return to duty related demands.     Time  8    Period  Weeks    Status  New    Target Date  03/12/17      PT LONG TERM GOAL #3   Title  Patient will be able to complete morning physical training sessions with no more than 1/10 pain to demonstrate improved tolerance for duty demands.     Time  8    Period  Weeks    Status  New    Target Date  03/12/17            Plan - 02/26/17 1055    Clinical Impression Statement  Patient demonstrates decreased pain after performing manual therapy indicating decreased muscular spasms and pain. Patient demonstrates increased pain after performing SLUMP positioning indicating increased neural tension/dysfunction. Patient will benefit from further skilled therapy to return to prior level of function.     Rehab Potential  Excellent    PT Frequency  2x / week    PT Duration  4 weeks    PT Treatment/Interventions  Iontophoresis 4mg /ml Dexamethasone;Gait training;Balance training;Neuromuscular re-education;Dry needling;Manual techniques;Therapeutic activities;Therapeutic exercise;Electrical Stimulation;Cryotherapy;Ultrasound;Biofeedback;Moist Heat;Taping    PT Next Visit Plan  Continue to assess for SIJ pathology vs gluteal muscle  loading issues.    PT Home Exercise Plan  Soft tissue mobilizations with tennis ball, isometric supine clamshells, single leg bridging, standing hip abductions and extensions     Consulted and Agree with Plan of Care  Patient       Patient will benefit from skilled therapeutic intervention in order to improve the following deficits and impairments:  Pain, Decreased strength, Decreased endurance, Decreased activity tolerance, Difficulty walking, Increased muscle spasms  Visit Diagnosis: Pain in left hip     Problem List There are no active problems to display for this patient.   Myrene GalasWesley Luverne Farone, PT DPT 02/26/2017, 11:01 AM  [place] [facility]ICINE [address] [place], [place], [address] Phone: [phone]   Fax:  [phone]  Name: Ashley Raymond MRN: [id number] Date of Birth: 03/20/1995

## 2017-03-03 ENCOUNTER — Ambulatory Visit

## 2017-03-03 DIAGNOSIS — M25552 Pain in left hip: Secondary | ICD-10-CM | POA: Diagnosis not present

## 2017-03-03 NOTE — Therapy (Signed)
[place] [facility] [address], [place], [address] Phone: [phone]   Fax:  [phone]  Physical Therapy Treatment  Patient Details  Name: Ashley Raymond MRN: [id number] Date of Birth: 29-Aug-1995 Referring Provider: Viviano Simas   Encounter Date: 03/03/2017  PT End of Session - 03/03/17 1045    Visit Number  8    Number of Visits  9    Date for PT Re-Evaluation  03/26/17    PT Start Time  0945    PT Stop Time  1030    PT Time Calculation (min)  45 min    Activity Tolerance  Patient tolerated treatment well    Behavior During Therapy  [facility] for tasks assessed/performed       History reviewed. No pertinent past medical history.  History reviewed. No pertinent surgical history.  There were no vitals filed for this visit.  Subjective Assessment - 03/03/17 1034    Subjective  Patient reports she ran yesterday and had a small amount of pain during the run but no increased pain the next day. Patient reports no pain currently.     Limitations  -- Running    Diagnostic tests  None at this point     Patient Stated Goals  To be able to return to her PT activities.     Currently in Pain?  No/denies    Pain Onset  More than a month ago        TREATMENT:   Manual therapy: Joint mobilizations in prone - unilaterally L5-L2  4 x 30sec grade IV; centrally S1-L2 4 x 30sec grade IV noted hypomobility along affected segmental levels; goal to improve mobility and decrease pain.  Sidelying mobilizations for lumbar mobilizations - 2 x 30sec grade IV stopped due to foot numbness   Therapeutic Exercise: Single leg stance squats - 2 x 12 Standing lumbar extension - x15 Single leg ball toss on bosu ball - 2 x 20 Squats on bosu ball with intermittent UE support - x 20   *Neuro glides to be performed next visit*    Patient reports no pain at end of session.   PT Education - 03/03/17 1043    Education provided  Yes    Education Details  Continue HEP; continue to run and monitor progress    Person(s) Educated  Patient    Methods  Demonstration;Explanation    Comprehension  Verbalized understanding;Returned demonstration          PT Long Term Goals - 01/15/17 1114      PT LONG TERM GOAL #1   Title  Patient will be able to run at least 3 miles with no increase in pain to return to ADLs.     Time  8    Period  Weeks    Status  New    Target Date  03/12/17      PT LONG TERM GOAL #2   Title  Patient will ruck at least 10 miles with no increase in pain to return to duty related demands.     Time  8    Period  Weeks    Status  New    Target Date  03/12/17      PT LONG TERM GOAL #3   Title  Patient will be able to complete morning physical training sessions with no more than 1/10 pain to demonstrate improved tolerance for duty demands.     Time  8  Period  Weeks    Status  New    Target Date  03/12/17            Plan - 03/03/17 1049    Clinical Impression Statement  Patient demonstrates less pain after running since the previous visitation session indicating functional carryover between visitation sessions. Patient demonstrates poor single leg balance indicating decreased propioception and static balance. Patient will benefit from further skilled therapy to return to prior level of function.     Rehab Potential  Excellent    PT Frequency  2x / week    PT Duration  4 weeks    PT Treatment/Interventions  Iontophoresis 4mg /ml Dexamethasone;Gait training;Balance training;Neuromuscular re-education;Dry needling;Manual techniques;Therapeutic activities;Therapeutic exercise;Electrical Stimulation;Cryotherapy;Ultrasound;Biofeedback;Moist Heat;Taping    PT Next Visit Plan  Continue to assess for SIJ pathology vs gluteal muscle loading issues.    PT Home Exercise Plan  Soft tissue mobilizations with tennis ball, isometric supine clamshells, single leg bridging, standing hip abductions and extensions      Consulted and Agree with Plan of Care  Patient       Patient will benefit from skilled therapeutic intervention in order to improve the following deficits and impairments:  Pain, Decreased strength, Decreased endurance, Decreased activity tolerance, Difficulty walking, Increased muscle spasms  Visit Diagnosis: Pain in left hip     Problem List There are no active problems to display for this patient.   Myrene GalasWesley Jennalee Greaves, PT DPT 03/03/2017, 11:08 AM  [place] [facility] [address] [place], [place], [address] Phone: [phone]   Fax:  [phone]  Name: Ashley Raymond MRN: [id number] Date of Birth: 06/22/1995

## 2017-03-05 ENCOUNTER — Ambulatory Visit

## 2017-03-05 DIAGNOSIS — M25552 Pain in left hip: Secondary | ICD-10-CM

## 2017-03-05 NOTE — Therapy (Signed)
[place] [facility]IC[facility] [address] [place], [place], [address] Phone: [phone]   Fax:  [phone]  Physical Therapy Treatment  Patient Details  Name: Baron Hamperngela Baillargeon MRN: [id number] Date of Birth: 11/08/1995 Referring Provider: Viviano SimasSarah Parker   Encounter Date: 03/05/2017  PT End of Session - 03/05/17 0828    Visit Number  9    Number of Visits  9    Date for PT Re-Evaluation  03/26/17    PT Start Time  0730    PT Stop Time  0815    PT Time Calculation (min)  45 min    Activity Tolerance  Patient tolerated treatment well    Behavior During Therapy  [facility] for tasks assessed/performed       History reviewed. No pertinent past medical history.  History reviewed. No pertinent surgical history.  There were no vitals filed for this visit.  Subjective Assessment - 03/05/17 0823    Subjective  Patient reports she ran 1.5 miles yesterday and reports it felt "really good". However today she states increased pain today.     Limitations  -- Running    Diagnostic tests  None at this point     Patient Stated Goals  To be able to return to her PT activities.     Currently in Pain?  No/denies    Pain Score  4     Pain Location  Back    Pain Orientation  Left    Pain Descriptors / Indicators  Aching    Pain Type  Chronic pain    Pain Onset  More than a month ago    Pain Frequency  Intermittent       TREATMENT:  Manual therapy: Joint mobilizations in prone - unilaterally L5-L2 6 x 30sec grade IV; centrally S1-L2 6 x 30sec grade IV noted hypomobility along affected segmental levels; goal to improve mobility and decrease pain.  Sidelying mobilizations for lumbar mobilizations - 3 x 30sec grade IV stopped due to foot numbness  Dry Needling: (2) 60mm x .25 mm placed along the lateral gluteal muscular along the right side to decrease increased spasms and pain with patient positioned in sidelying. Educated patients on benefits and  risks of therapy and patient verbally consents to therapy.   Patient demonstrates decreased pain at the end of the session   PT Education - 03/05/17 0827    Education provided  Yes    Education Details  added lumbar lateral flexion in standing added to HEP    Person(s) Educated  Patient    Methods  Explanation;Demonstration    Comprehension  Verbalized understanding;Returned demonstration          PT Long Term Goals - 01/15/17 1114      PT LONG TERM GOAL #1   Title  Patient will be able to run at least 3 miles with no increase in pain to return to ADLs.     Time  8    Period  Weeks    Status  New    Target Date  03/12/17      PT LONG TERM GOAL #2   Title  Patient will ruck at least 10 miles with no increase in pain to return to duty related demands.     Time  8    Period  Weeks    Status  New    Target Date  03/12/17      PT LONG TERM GOAL #3  Title  Patient will be able to complete morning physical training sessions with no more than 1/10 pain to demonstrate improved tolerance for duty demands.     Time  8    Period  Weeks    Status  New    Target Date  03/12/17            Plan - 03/05/17 0981    Clinical Impression Statement  Patient demonstrates decreased pain after performing manual therapy, however continues to have increased pain with lumbar movement. Performed dry needling to decrease pain and spasms along the medial gluteal musculature; patient demonstrates one muscle twitch with dry needling. Patient continues to demonstrates hypomobility with lumbar/sacral mobilizations and patient will benefit from further skilled therapy focused on improving lumbar mobility and pain.     Rehab Potential  Excellent    PT Frequency  2x / week    PT Duration  4 weeks    PT Treatment/Interventions  Iontophoresis 4mg /ml Dexamethasone;Gait training;Balance training;Neuromuscular re-education;Dry needling;Manual techniques;Therapeutic activities;Therapeutic  exercise;Electrical Stimulation;Cryotherapy;Ultrasound;Biofeedback;Moist Heat;Taping    PT Next Visit Plan  Continue to assess for SIJ pathology vs gluteal muscle loading issues.    PT Home Exercise Plan  Soft tissue mobilizations with tennis ball, isometric supine clamshells, single leg bridging, standing hip abductions and extensions     Consulted and Agree with Plan of Care  Patient       Patient will benefit from skilled therapeutic intervention in order to improve the following deficits and impairments:  Pain, Decreased strength, Decreased endurance, Decreased activity tolerance, Difficulty walking, Increased muscle spasms  Visit Diagnosis: Pain in left hip     Problem List There are no active problems to display for this patient.   Myrene Galas, PT DPT 03/05/2017, 8:42 AM  [place] [facility] PHYSICAL AND SPORTS MEDICINE [address] [place], [address] Phone: [phone]   Fax:  [phone]  Name: Natina Wiginton MRN: [id number] Date of Birth: 12-08-1995

## 2017-03-11 ENCOUNTER — Encounter

## 2017-03-17 ENCOUNTER — Ambulatory Visit: Attending: Nurse Practitioner

## 2017-03-17 DIAGNOSIS — M25552 Pain in left hip: Secondary | ICD-10-CM | POA: Diagnosis not present

## 2017-03-17 NOTE — Therapy (Signed)
[place] [facility] PHYS[facility]TS MEDICINE [address] [place], [address] Phone: [phone]   Fax:  [phone]  Physical Therapy Treatment  Patient Details  Name: Ashley Raymond MRN: [id number] Date of Birth: 1995/04/12 Referring Provider: Viviano Simas   Encounter Date: 03/17/2017  PT End of Session - 03/17/17 0852    Visit Number  10    Number of Visits  9    Date for PT Re-Evaluation  03/26/17    PT Start Time  0730    PT Stop Time  0815    PT Time Calculation (min)  45 min    Activity Tolerance  Patient tolerated treatment well    Behavior During Therapy  [facility] for tasks assessed/performed       History reviewed. No pertinent past medical history.  History reviewed. No pertinent surgical history.  There were no vitals filed for this visit.  Subjective Assessment - 03/17/17 0733    Subjective  Patient reports pain is currently 0/10. Pt states she only experiences pain with increased activity and that worst pain is 4/10. Pt. states she had "no pain" two days after dry-needling and that her hip felt "great." Pt reports she was able to run 1 mile since previous session without increased pain.    Limitations  -- Running    Diagnostic tests  None at this point     Patient Stated Goals  To be able to return to her PT activities.     Currently in Pain?  No/denies    Pain Onset  More than a month ago        Treatment-   Dry Needling: - performed with patient in sidelying (1) x .25 mm placed along the lateral gluteal muscular along the right side to decrease increased spasms and pain with patient positioned in sidelying. Educated patients on benefits and risks of therapy and patient verbally consents to therapy.    Therapeutic Exercise -  L hip extension in prone - 1x30 to increase activation of the L hip extensors   Standing hip abduction ont Matrix - 1x20 B #40 to increase strength and endurance of the hip  musculature  Monster walks with GTB - 16x20 ft  To increase strength of hip and LE musculature   Treadmill -- 3 minx 6 runnning mph to assess motor control of the muscles of the hip and LEs   Slump nerve glides -- x 20 on the affected side    Patient demonstrates decreased pain at the end of the session     PT Education - 03/17/17 0851    Education provided  Yes    Education Details  Form/technique with exercise    Person(s) Educated  Patient    Methods  Demonstration;Explanation    Comprehension  Verbalized understanding;Returned demonstration          PT Long Term Goals - 01/15/17 1114      PT LONG TERM GOAL #1   Title  Patient will be able to run at least 3 miles with no increase in pain to return to ADLs.     Time  8    Period  Weeks    Status  New    Target Date  03/12/17      PT LONG TERM GOAL #2   Title  Patient will ruck at least 10 miles with no increase in pain to return to duty related demands.     Time  8  Period  Weeks    Status  New    Target Date  03/12/17      PT LONG TERM GOAL #3   Title  Patient will be able to complete morning physical training sessions with no more than 1/10 pain to demonstrate improved tolerance for duty demands.     Time  8    Period  Weeks    Status  New    Target Date  03/12/17            Plan - 03/17/17 0853    Clinical Impression Statement  Continued to focus on dry needling as pt reported decreased pain with running after dry needling from previous session. Patient demonstrated no increased pain with exercise that focused on activating the hip musculature after dry needling this session. Although patient demonstrates improvement, patient continues to demonstrate aberrant movement of the hip with running, indicating poor motor control of the core and hip musculature. Patient will benefit from further skilled therapy to return to prior levels of function.     Rehab Potential  Excellent    PT Frequency  2x / week     PT Duration  4 weeks    PT Treatment/Interventions  Iontophoresis 4mg /ml Dexamethasone;Gait training;Balance training;Neuromuscular re-education;Dry needling;Manual techniques;Therapeutic activities;Therapeutic exercise;Electrical Stimulation;Cryotherapy;Ultrasound;Biofeedback;Moist Heat;Taping    PT Next Visit Plan  Continue to assess for SIJ pathology vs gluteal muscle loading issues.    PT Home Exercise Plan  Soft tissue mobilizations with tennis ball, isometric supine clamshells, single leg bridging, standing hip abductions and extensions     Consulted and Agree with Plan of Care  Patient       Patient will benefit from skilled therapeutic intervention in order to improve the following deficits and impairments:  Pain, Decreased strength, Decreased endurance, Decreased activity tolerance, Difficulty walking, Increased muscle spasms  Visit Diagnosis: Pain in left hip     Problem List There are no active problems to display for this patient.   [place] PaciniHaley Ermalee Mealy, SPT 03/17/2017, 8:57 AM  [place] [facility] PHYSICAL [facility]TS MEDICINE [address] [place], [place], [address] Phone: [phone]   Fax:  [phone]  Name: Ashley Raymond MRN: [id number] Date of Birth: 08/07/1995

## 2017-04-01 ENCOUNTER — Ambulatory Visit

## 2017-04-01 DIAGNOSIS — M25552 Pain in left hip: Secondary | ICD-10-CM

## 2017-04-01 NOTE — Therapy (Signed)
[place] [facility] PHYSICAL AND SPORTS MEDICINE [address] [place], [place], [address] Phone: [phone]   Fax:  [phone]  Physical Therapy Treatment  Patient Details  Name: Ashley Raymond MRN: [id number] Date of Birth: 06/24/1995 Referring Provider: Viviano SimasSarah Parker   Encounter Date: 04/01/2017  PT End of Session - 04/01/17 1110    Visit Number  11    Number of Visits  19    Date for PT Re-Evaluation  05/13/17    PT Start Time  1030    PT Stop Time  1115    PT Time Calculation (min)  45 min    Activity Tolerance  Patient tolerated treatment well    Behavior During Therapy  [facility]       History reviewed. No pertinent past medical history.  History reviewed. No pertinent surgical history.  There were no vitals filed for this visit.  Subjective Assessment - 04/01/17 1035    Subjective  Patient reports the pain felt much better the past two weeks, however had to run 5 miles today and had some pain during the run.     Limitations  -- Running    Diagnostic tests  None at this point     Patient Stated Goals  To be able to return to her PT activities.     Currently in Pain?  No/denies    Pain Onset  More than a month ago       Treatment-    Dry Needling: - performed with patient in sidelying (7 min) (3) 100mm x .25 mm placed along the lateral gluteal muscular along the right side to decrease increased spasms and pain with patient positioned in sidelying. Educated patients on benefits and risks of therapy and patient verbally consents to therapy.      Therapeutic Exercise -   L hip extension in prone - 1x10 to increase activation of the L hip extensors    Single leg bridges in hooklying - x 20   Side lying hip abduction - 2 x 20  Manual Therapy:  Manual therapy: Joint mobilizations in prone - unilaterally L5-L2  6 x 30sec grade IV; centrally S1-L2 6 x 30sec grade IV noted hypomobility along affected segmental  levels; goal to improve mobility and decrease pain.  STM performed to glute med and glute max to decrease increased pain and spasms with patient positioned in prone.   Patient demonstrates decreased pain at the end of the session   PT Education - 04/01/17 1059    Education provided  Yes    Education Details  form/technique with exercise    Person(s) Educated  Patient    Methods  Explanation;Demonstration    Comprehension  Verbalized understanding;Returned demonstration          PT Long Term Goals - 04/01/17 1118      PT LONG TERM GOAL #1   Title  Patient will be able to run at least 3 miles with no increase in pain to return to ADLs.     Baseline  04/01/2017: Able to run 1.5 miles without pain during or the following day    Time  6    Period  Weeks    Status  On-going      PT LONG TERM GOAL #2   Title  Patient will ruck at least 10 miles with no increase in pain to return to duty related demands.     Baseline  Unable to ruck without increase  in pain     Time  6    Period  Weeks    Status  On-going      PT LONG TERM GOAL #3   Title  Patient will be able to complete morning physical training sessions with no more than 1/10 pain to demonstrate improved tolerance for duty demands.     Baseline  04/01/16: Slight increase in pain 3/10 with performing physical performance measures.     Time  8    Period  Weeks    Status  New            Plan - 04/01/17 1128    Clinical Impression Statement  Patient is making progress towards long term goals with greater ability to run without increased pain. Patient was able to run 1.5 miles without increased pain during the run or afterwards. However, when running 5 miles, she had increased pain during the run indicating continued muscular weakness and strain. Patient will benefit from further skilled therapy focused on improving these current limitations to return to prior level of function.     Rehab Potential  Excellent    PT Frequency   2x / week    PT Duration  4 weeks    PT Treatment/Interventions  Iontophoresis 4mg /ml Dexamethasone;Gait training;Balance training;Neuromuscular re-education;Dry needling;Manual techniques;Therapeutic activities;Therapeutic exercise;Electrical Stimulation;Cryotherapy;Ultrasound;Biofeedback;Moist Heat;Taping    PT Next Visit Plan  Continue to assess for SIJ pathology vs gluteal muscle loading issues.    PT Home Exercise Plan  Soft tissue mobilizations with tennis ball, isometric supine clamshells, single leg bridging, standing hip abductions and extensions     Consulted and Agree with Plan of Care  Patient       Patient will benefit from skilled therapeutic intervention in order to improve the following deficits and impairments:  Pain, Decreased strength, Decreased endurance, Decreased activity tolerance, Difficulty walking, Increased muscle spasms  Visit Diagnosis: Pain in left hip     Problem List There are no active problems to display for this patient.   Gerri Spore Nakyia Dau 04/01/2017, 11:37 AM  [place] [facility] PHYSICAL AND SPORTS MEDICINE [address], [place], [address] Phone: [phone]   Fax:  [phone]  Name: Ashley Raymond MRN: [id number] Date of Birth: [birth year]

## 2017-04-09 ENCOUNTER — Ambulatory Visit

## 2017-04-09 DIAGNOSIS — M25552 Pain in left hip: Secondary | ICD-10-CM

## 2017-04-09 NOTE — Therapy (Signed)
[place] [facility] PH[facility] [address] [place], [place], [address] Phone: [phone]   Fax:  [phone]  Physical Therapy Treatment  Patient Details  Name: Baron Hamperngela Venard MRN: [id number] Date of Birth: 01/27/1996 Referring Provider: Viviano SimasSarah Parker   Encounter Date: 04/09/2017  PT End of Session - 04/09/17 0803    Visit Number  12    Number of Visits  19    Date for PT Re-Evaluation  05/13/17    PT Start Time  0735    PT Stop Time  0806    PT Time Calculation (min)  31 min    Activity Tolerance  Patient tolerated treatment well    Behavior During Therapy  [facility]       History reviewed. No pertinent past medical history.  History reviewed. No pertinent surgical history.  There were no vitals filed for this visit.  Subjective Assessment - 04/09/17 0752    Subjective  Patient reports she was able to run 5 milees without pain but had increased pain after working out 2 days. Patient reports the pain is decreased over time.     Limitations  -- Running    Diagnostic tests  None at this point     Patient Stated Goals  To be able to return to her PT activities.     Currently in Pain?  No/denies    Pain Onset  More than a month ago         TREATMENT   Dry Needling: - performed with patient in sidelying (6 min) (2) 60mm x .25 mm placed along the lateral gluteal muscular along the right side to decrease increased spasms and pain with patient positioned in sidelying. Educated patients on benefits and risks of therapy and patient verbally consents to therapy.     Therapeutic Exercise -   L hip extension in prone - x20 to increase activation of the L hip extensors    Ball tosses with single leg mini squat - 2 x 20   Side lying hip abduction - 2 x 20   Single leg mini squats at TRX - x 20 B    Patient demonstrates decreased pain at the end of the session  PT Education - 04/09/17 0801    Education  provided  Yes    Education Details  continue with HEP performance.     Person(s) Educated  Patient    Methods  Explanation;Demonstration    Comprehension  Verbalized understanding;Returned demonstration          PT Long Term Goals - 04/01/17 1118      PT LONG TERM GOAL #1   Title  Patient will be able to run at least 3 miles with no increase in pain to return to ADLs.     Baseline  04/01/2017: Able to run 1.5 miles without pain during or the following day    Time  6    Period  Weeks    Status  On-going      PT LONG TERM GOAL #2   Title  Patient will ruck at least 10 miles with no increase in pain to return to duty related demands.     Baseline  Unable to ruck without increase in pain     Time  6    Period  Weeks    Status  On-going      PT LONG TERM GOAL #3   Title  Patient will be able  to complete morning physical training sessions with no more than 1/10 pain to demonstrate improved tolerance for duty demands.     Baseline  04/01/16: Slight increase in pain 3/10 with performing physical performance measures.     Time  8    Period  Weeks    Status  New            Plan - 04/09/17 4782    Clinical Impression Statement  Patient is making progress towards long term goals with ability to run for a greater amount of time before onset of pain. COnitnues to perform dry needling to improve tissue elasticity and decrease muscle guaridng. Performed greater amount of exercise today and patient demonstrates no increase in pain throughout the session. Patient will benefit from further skilled therapy to return to prior level of function.     Rehab Potential  Excellent    PT Frequency  2x / week    PT Duration  4 weeks    PT Treatment/Interventions  Iontophoresis 4mg /ml Dexamethasone;Gait training;Balance training;Neuromuscular re-education;Dry needling;Manual techniques;Therapeutic activities;Therapeutic exercise;Electrical Stimulation;Cryotherapy;Ultrasound;Biofeedback;Moist  Heat;Taping    PT Next Visit Plan  Continue to assess for SIJ pathology vs gluteal muscle loading issues.    PT Home Exercise Plan  Soft tissue mobilizations with tennis ball, isometric supine clamshells, single leg bridging, standing hip abductions and extensions     Consulted and Agree with Plan of Care  Patient       Patient will benefit from skilled therapeutic intervention in order to improve the following deficits and impairments:  Pain, Decreased strength, Decreased endurance, Decreased activity tolerance, Difficulty walking, Increased muscle spasms  Visit Diagnosis: Pain in left hip     Problem List There are no active problems to display for this patient.   Myrene Galas, PT DPT 04/09/2017, 8:32 AM  [facility] PHYSICAL AND SPORTS MEDICINE [address] [place], [address] Phone: [phone]   Fax:  [phone]  Name: Karalina Tift MRN: [id number] Date of Birth: [birth year]

## 2017-04-14 ENCOUNTER — Ambulatory Visit: Attending: Nurse Practitioner

## 2017-04-14 DIAGNOSIS — M25552 Pain in left hip: Secondary | ICD-10-CM | POA: Insufficient documentation

## 2017-04-22 ENCOUNTER — Ambulatory Visit

## 2017-04-22 DIAGNOSIS — M25552 Pain in left hip: Secondary | ICD-10-CM

## 2017-04-22 NOTE — Therapy (Signed)
[place] [facility]TS MEDICINE [address] [place], [address] Phone: [phone]   Fax:  [phone]  Physical Therapy Treatment  Patient Details  Name: Ashley Raymond MRN: [id number] Date of Birth: [birth year] Referring Provider: Viviano Simas   Encounter Date: 04/22/2017  PT End of Session - 04/22/17 0841    Visit Number  13    Number of Visits  19    Date for PT Re-Evaluation  05/13/17    PT Start Time  0815    PT Stop Time  0900    PT Time Calculation (min)  45 min    Activity Tolerance  Patient tolerated treatment well    Behavior During Therapy  [facility] for tasks assessed/performed       History reviewed. No pertinent past medical history.  History reviewed. No pertinent surgical history.  There were no vitals filed for this visit.  Subjective Assessment - 04/22/17 0832    Subjective  Patient reports she did a lot of running over the weekend and had increased pain in the hip/lumbar pain. Patient states her PT test is next week.     Limitations  -- Running    Diagnostic tests  None at this point     Patient Stated Goals  To be able to return to her PT activities.     Currently in Pain?  No/denies    Pain Onset  More than a month ago        TREATMENT: Dry Needling: - performed with patient in sidelying (10 min) (2) 60mm x .25 mm placed along the lateral gluteal muscular along the right side to decrease increased spasms and pain with patient positioned in sidelying. Educated patients on benefits and risks of therapy and patient verbally consents to therapy.  Therapeutic Exercise Hip hikes off of 3" step - 3 x 20  Sidelying hip abduction - 3 x 12 Standing hip abduction at hip machine - 2 x 20 20# Single leg stance ball toss at rebounder - 3 x 20 Single leg stance ball toss pressing 2kg ball laterally into wall - x 20 B Balance master - 5 min with limits of stability level 1 and 12  Patient demonstrates decreased  pain after dry needling   PT Education - 04/22/17 0841    Education provided  Yes    Education Details  form/technique with exercise    Person(s) Educated  Patient    Methods  Explanation;Demonstration    Comprehension  Verbalized understanding;Returned demonstration          PT Long Term Goals - 04/01/17 1118      PT LONG TERM GOAL #1   Title  Patient will be able to run at least 3 miles with no increase in pain to return to ADLs.     Baseline  04/01/2017: Able to run 1.5 miles without pain during or the following day    Time  6    Period  Weeks    Status  On-going      PT LONG TERM GOAL #2   Title  Patient will ruck at least 10 miles with no increase in pain to return to duty related demands.     Baseline  Unable to ruck without increase in pain     Time  6    Period  Weeks    Status  On-going      PT LONG TERM GOAL #3   Title  Patient will be  able to complete morning physical training sessions with no more than 1/10 pain to demonstrate improved tolerance for duty demands.     Baseline  04/01/16: Slight increase in pain 3/10 with performing physical performance measures.     Time  8    Period  Weeks    Status  New            Plan - 04/22/17 [id number]    Clinical Impression Statement  Patient demonstrates decreased pain after performing dry needling on the affected side along the glute med indicating increased spasms and guarding. Patient demonstares early onset of fatigue with performance of glute med exercises and patient will benefit from further skilled therapy to return to prior level of function.     Rehab Potential  Excellent    PT Frequency  2x / week    PT Duration  4 weeks    PT Treatment/Interventions  Iontophoresis 4mg /ml Dexamethasone;Gait training;Balance training;Neuromuscular re-education;Dry needling;Manual techniques;Therapeutic activities;Therapeutic exercise;Electrical Stimulation;Cryotherapy;Ultrasound;Biofeedback;Moist Heat;Taping    PT Next Visit  Plan  Continue to assess for SIJ pathology vs gluteal muscle loading issues.    PT Home Exercise Plan  Soft tissue mobilizations with tennis ball, isometric supine clamshells, single leg bridging, standing hip abductions and extensions     Consulted and Agree with Plan of Care  Patient       Patient will benefit from skilled therapeutic intervention in order to improve the following deficits and impairments:  Pain, Decreased strength, Decreased endurance, Decreased activity tolerance, Difficulty walking, Increased muscle spasms  Visit Diagnosis: Pain in left hip     Problem List There are no active problems to display for this patient.   Myrene GalasWesley Bevan Disney, PT DPT 04/22/2017, 8:51 AM  [place] [facility] PHYSICAL AND SPORTS MEDICINE [address] [place], [place], [address] Phone: [phone]   Fax:  [phone]  Name: Ashley Raymond MRN: [id number] Date of Birth: 08/14/1995

## 2017-04-29 ENCOUNTER — Ambulatory Visit

## 2017-04-29 DIAGNOSIS — M25552 Pain in left hip: Secondary | ICD-10-CM

## 2017-04-29 NOTE — Therapy (Signed)
[place] [facility] PHYSICAL AND SPORTS MEDICINE [address] [place], [place], [address] Phone: [phone]   Fax:  [phone]  Physical Therapy Treatment  Patient Details  Name: Ashley Raymond MRN: [id number] Date of Birth: 07/01/1995 Referring Provider: Viviano SimasSarah Parker   Encounter Date: 04/29/2017  PT End of Session - 04/29/17 1314    Visit Number  14    Number of Visits  19    Date for PT Re-Evaluation  05/13/17    PT Start Time  0900    PT Stop Time  0945    PT Time Calculation (min)  45 min    Activity Tolerance  Patient tolerated treatment well    Behavior During Therapy  [facility] for tasks assessed/performed       History reviewed. No pertinent past medical history.  History reviewed. No pertinent surgical history.  There were no vitals filed for this visit.  Subjective Assessment - 04/29/17 0941    Subjective  Patient reports she had her PT test today which she passed but states her times were slower than the previoous time she had taken the PT test.     Limitations  -- Running    Diagnostic tests  None at this point     Patient Stated Goals  To be able to return to her PT activities.     Currently in Pain?  No/denies    Pain Onset  More than a month ago       TREATMENT: Dry Needling: - performed with patient in sidelying (6 min) (2) 60mm x .25 mm placed along the lateral gluteal muscular along the right side to decrease increased spasms and pain with patient positioned in sidelying. Educated patients on benefits and risks of therapy and patient verbally consents to therapy.   Therapeutic Exercise Monster walks with RTB around knees forward/backward - 2 x6930ft in each direction Ball toss with pushing ball into wall during a mini squat - 2 x 20 performed B Standing hip ER with RTB Standing - 2 x 20 B Hip extension/abduction with RTB in standing - 2 x 20   Manual Therapy: STM performed to patient's L side while positioned in sidelying along  her glute med to decrease increased pain and spasms utilizing superficial and deep techniques.  Active release technique performed to patient's affected side to decrease increased pain and spasms held for 1 min performed x3 with patient in sidelying raising leg to hip flexion and hip adduction  Patient demonstrates decreased pain at the end of the session   PT Education - 04/29/17 1314    Education provided  Yes    Education Details  HEP: monster walking with RTB; standing hip ER with RTB around knees    Person(s) Educated  Patient    Methods  Explanation;Demonstration    Comprehension  Verbalized understanding;Returned demonstration          PT Long Term Goals - 04/01/17 1118      PT LONG TERM GOAL #1   Title  Patient will be able to run at least 3 miles with no increase in pain to return to ADLs.     Baseline  04/01/2017: Able to run 1.5 miles without pain during or the following day    Time  6    Period  Weeks    Status  On-going      PT LONG TERM GOAL #2   Title  Patient will ruck at least 10 miles with no increase  in pain to return to duty related demands.     Baseline  Unable to ruck without increase in pain     Time  6    Period  Weeks    Status  On-going      PT LONG TERM GOAL #3   Title  Patient will be able to complete morning physical training sessions with no more than 1/10 pain to demonstrate improved tolerance for duty demands.     Baseline  04/01/16: Slight increase in pain 3/10 with performing physical performance measures.     Time  8    Period  Weeks    Status  New            Plan - 04/29/17 1315    Clinical Impression Statement  Patient continues to demonstrates increased muscular spasms and pain along the glute med resulting in increased low back pain with palpation to the area. Patient continues to demosntrates decreased pain along the lumbar spine and hip after performing dry needling and manual therapy to the area indicating decreased muscular  spasms and pain. Patient will benefit from furhter skilled therapy focused on improving current limitations to return to prior level of function.     Rehab Potential  Excellent    PT Frequency  2x / week    PT Duration  4 weeks    PT Treatment/Interventions  Iontophoresis 4mg /ml Dexamethasone;Gait training;Balance training;Neuromuscular re-education;Dry needling;Manual techniques;Therapeutic activities;Therapeutic exercise;Electrical Stimulation;Cryotherapy;Ultrasound;Biofeedback;Moist Heat;Taping    PT Next Visit Plan  Continue to assess for SIJ pathology vs gluteal muscle loading issues.    PT Home Exercise Plan  Soft tissue mobilizations with tennis ball, isometric supine clamshells, single leg bridging, standing hip abductions and extensions     Consulted and Agree with Plan of Care  Patient       Patient will benefit from skilled therapeutic intervention in order to improve the following deficits and impairments:  Pain, Decreased strength, Decreased endurance, Decreased activity tolerance, Difficulty walking, Increased muscle spasms  Visit Diagnosis: Pain in left hip     Problem List There are no active problems to display for this patient.   Myrene Galas, PT DPT 04/29/2017, 1:18 PM  [place] [facility] PHYSICAL AND SPORTS MEDICINE [address], [place], [address] Phone: [phone]   Fax:  [phone]  Name: Ashley Raymond MRN: [id number] Date of Birth: January 15, 1996

## 2017-05-06 ENCOUNTER — Ambulatory Visit

## 2017-05-07 ENCOUNTER — Ambulatory Visit

## 2017-05-07 DIAGNOSIS — M25552 Pain in left hip: Secondary | ICD-10-CM

## 2017-05-07 NOTE — Therapy (Signed)
[facility] [address] [place], [address] Phone: [phone]   Fax:  [phone]  Physical Therapy Treatment  Patient Details  Name: Ashley Raymond MRN: [id number] Date of Birth: [birth year] Referring Provider: Viviano Simas   Encounter Date: 05/07/2017  PT End of Session - 05/07/17 1815    Visit Number  15    Number of Visits  19    Date for PT Re-Evaluation  05/13/17    PT Start Time  1725    PT Stop Time  1810    PT Time Calculation (min)  45 min    Activity Tolerance  Patient tolerated treatment well    Behavior During Therapy  [facility] for tasks assessed/performed       History reviewed. No pertinent past medical history.  History reviewed. No pertinent surgical history.  There were no vitals filed for this visit.  Subjective Assessment - 05/07/17 1758    Subjective  Patient reports her hip/back has been feeling 'much better' but is slightly concerned for her training this weekend.     Limitations  -- Running    Diagnostic tests  None at this point     Patient Stated Goals  To be able to return to her PT activities.     Currently in Pain?  No/denies    Pain Onset  More than a month ago       TREATMENT: Dry Needling: - performed with patient in sidelying (6 min) (1) x .3 mm placed along the lateral gluteal muscular along the right side to decrease increased spasms and pain with patient positioned in sidelying. Educated patients on benefits and risks of therapy and patient verbally consents to therapy.    Therapeutic Exercise Monster walks with GTB around knees forward/backward - 2 x39ft in each direction Standing hip abduction at hip machine - 2 x 20 B 55# Heel taps off of 6" step with UE support - x 20  Cone taps with single leg stance (tapping the cone with hands) - 2 x 8 rounds (touching 3 cones)   Manual Therapy: STM performed to patient's L side while positioned in sidelying along her  glute med to decrease increased pain and spasms utilizing superficial and deep techniques.    Patient demonstrates decreased pain at the end of the session   PT Education - 05/07/17 1815    Education provided  Yes    Education Details  form/technique with exercise    Person(s) Educated  Patient    Methods  Explanation;Demonstration    Comprehension  Verbalized understanding;Returned demonstration          PT Long Term Goals - 04/01/17 1118      PT LONG TERM GOAL #1   Title  Patient will be able to run at least 3 miles with no increase in pain to return to ADLs.     Baseline  04/01/2017: Able to run 1.5 miles without pain during or the following day    Time  6    Period  Weeks    Status  On-going      PT LONG TERM GOAL #2   Title  Patient will ruck at least 10 miles with no increase in pain to return to duty related demands.     Baseline  Unable to ruck without increase in pain     Time  6    Period  Weeks    Status  On-going  PT LONG TERM GOAL #3   Title  Patient will be able to complete morning physical training sessions with no more than 1/10 pain to demonstrate improved tolerance for duty demands.     Baseline  04/01/16: Slight increase in pain 3/10 with performing physical performance measures.     Time  8    Period  Weeks    Status  New            Plan - 05/07/17 1816    Clinical Impression Statement  Patient demonstrates improvement in hip stabilization with ability to perform heel taps without significant hip drop indicating improvement in hip coordination and decrease in pain. Although patient is improving, she continues to have increase in pain when running for an extended period of time and will benefit from further skilled therapy to return to prior level of function.     Rehab Potential  Excellent    PT Frequency  2x / week    PT Duration  4 weeks    PT Treatment/Interventions  Iontophoresis 4mg /ml Dexamethasone;Gait training;Balance  training;Neuromuscular re-education;Dry needling;Manual techniques;Therapeutic activities;Therapeutic exercise;Electrical Stimulation;Cryotherapy;Ultrasound;Biofeedback;Moist Heat;Taping    PT Next Visit Plan  Continue to assess for SIJ pathology vs gluteal muscle loading issues.    PT Home Exercise Plan  Soft tissue mobilizations with tennis ball, isometric supine clamshells, single leg bridging, standing hip abductions and extensions     Consulted and Agree with Plan of Care  Patient       Patient will benefit from skilled therapeutic intervention in order to improve the following deficits and impairments:  Pain, Decreased strength, Decreased endurance, Decreased activity tolerance, Difficulty walking, Increased muscle spasms  Visit Diagnosis: Pain in left hip     Problem List There are no active problems to display for this patient.   Myrene GalasWesley Kiernan Atkerson, PT DPT 05/07/2017, 6:20 PM  [facility] [address] [place], [place], [address] Phone: [phone]   Fax:  [phone]  Name: Ashley Raymond MRN: [id number] Date of Birth: 04/06/1995

## 2017-05-13 ENCOUNTER — Ambulatory Visit: Attending: Nurse Practitioner

## 2017-05-13 DIAGNOSIS — M25552 Pain in left hip: Secondary | ICD-10-CM

## 2017-05-13 NOTE — Therapy (Signed)
[facility] [address] [place], [address] Phone: [phone]   Fax:  [phone]  Physical Therapy Treatment  Patient Details  Name: Ashley Raymond MRN: [id number] Date of Birth: 1995-10-18 Referring Provider: Viviano Simas   Encounter Date: 05/13/2017  PT End of Session - 05/13/17 0913    Visit Number  16    Number of Visits  19    Date for PT Re-Evaluation  05/13/17    PT Start Time  0900    PT Stop Time  0945    PT Time Calculation (min)  45 min    Activity Tolerance  Patient tolerated treatment well    Behavior During Therapy  [facility] for tasks assessed/performed       History reviewed. No pertinent past medical history.  History reviewed. No pertinent surgical history.  There were no vitals filed for this visit.  Subjective Assessment - 05/13/17 0908    Subjective  Patient reports her hip and back has no pain today and states she was able to ruck march all weekend without increase in pain in the back.     Limitations  -- Running    Diagnostic tests  None at this point     Patient Stated Goals  To be able to return to her PT activities.     Currently in Pain?  No/denies    Pain Onset  More than a month ago        TREATMENT: Dry Needling: - performed with patient in sidelying (6 min) (1) x .3 mm placed along the lateral gluteal muscular along the right side to decrease increased spasms and pain with patient positioned in sidelying. Educated patients on benefits and risks of therapy and patient verbally consents to therapy.    Therapeutic Exercise Standing lunges in stepping formation - x10 Hip circles with slider into a mini squat - 2 x 20 Running man on airex pad with slider - 2 x 10  Hip machine 55# with hip abduction - 2 x 20  Monster walks with GTB around knees backward - 4 x58ft in each direction  Patient demonstrates decreased pain at the end of the session   PT Education -  05/13/17 0912    Education provided  Yes    Education Details  form/technique with exercise    Person(s) Educated  Patient    Methods  Explanation;Demonstration    Comprehension  Verbalized understanding;Returned demonstration          PT Long Term Goals - 04/01/17 1118      PT LONG TERM GOAL #1   Title  Patient will be able to run at least 3 miles with no increase in pain to return to ADLs.     Baseline  04/01/2017: Able to run 1.5 miles without pain during or the following day    Time  6    Period  Weeks    Status  On-going      PT LONG TERM GOAL #2   Title  Patient will ruck at least 10 miles with no increase in pain to return to duty related demands.     Baseline  Unable to ruck without increase in pain     Time  6    Period  Weeks    Status  On-going      PT LONG TERM GOAL #3   Title  Patient will be able to complete morning physical training sessions with  no more than 1/10 pain to demonstrate improved tolerance for duty demands.     Baseline  04/01/16: Slight increase in pain 3/10 with performing physical performance measures.     Time  8    Period  Weeks    Status  New            Plan - 05/13/17 0939    Clinical Impression Statement  Patient demonstrates improvement in pain and soreness along her hip today and reports she was able to perform a ruck without sharp increase in low back pain indicating functional carryover between sessions. Although patient is improving, she continues to be fearful of running long distances without increase in pain and will focus remainign sessions on performing running activities without increase in pain afterwards.     Rehab Potential  Excellent    PT Frequency  2x / week    PT Duration  4 weeks    PT Treatment/Interventions  Iontophoresis 4mg /ml Dexamethasone;Gait training;Balance training;Neuromuscular re-education;Dry needling;Manual techniques;Therapeutic activities;Therapeutic exercise;Electrical  Stimulation;Cryotherapy;Ultrasound;Biofeedback;Moist Heat;Taping    PT Next Visit Plan  Continue to assess for SIJ pathology vs gluteal muscle loading issues.    PT Home Exercise Plan  Soft tissue mobilizations with tennis ball, isometric supine clamshells, single leg bridging, standing hip abductions and extensions     Consulted and Agree with Plan of Care  Patient       Patient will benefit from skilled therapeutic intervention in order to improve the following deficits and impairments:  Pain, Decreased strength, Decreased endurance, Decreased activity tolerance, Difficulty walking, Increased muscle spasms  Visit Diagnosis: Pain in left hip     Problem List There are no active problems to display for this patient.   Myrene GalasWesley Siyana Erney, PT DPT 05/13/2017, 9:46 AM  [place] [facility] [address] [place], [place], [address] Phone: [phone]   Fax:  [phone]  Name: Ashley Raymond MRN: [id number] Date of Birth: 07/28/1995

## 2017-05-22 ENCOUNTER — Ambulatory Visit

## 2017-05-22 DIAGNOSIS — M25552 Pain in left hip: Secondary | ICD-10-CM | POA: Diagnosis not present

## 2017-05-22 NOTE — Addendum Note (Signed)
Addended by: Bethanie DickerISSELL, Roberts V on: 05/22/2017 10:34 AM   Modules accepted: Orders

## 2017-05-22 NOTE — Therapy (Signed)
[place] [facility] [address] [place], [place], [address] Phone: [phone]   Fax:  [phone]  Physical Therapy Treatment  Patient Details  Name: Ashley Raymond MRN: [id number] Date of Birth: 02/06/1996 Referring Provider: Viviano SimasSarah Parker   Encounter Date: 05/22/2017  PT End of Session - 05/22/17 1017    Visit Number  17    Number of Visits  19    Date for PT Re-Evaluation  07/03/17    PT Start Time  0900    PT Stop Time  0945    PT Time Calculation (min)  45 min    Activity Tolerance  Patient tolerated treatment well    Behavior During Therapy  [facility] for tasks assessed/performed       History reviewed. No pertinent past medical history.  History reviewed. No pertinent surgical history.  There were no vitals filed for this visit.  Subjective Assessment - 05/22/17 1010    Subjective  Patient reports she  has increased pain after performing a ruck march today. Patient states increased pain along her L glute. Patient states she was able to run 3 seperate times without increase in pain before hand. Patient reports she had to go up many hills today.     Limitations  -- Running    Diagnostic tests  None at this point     Patient Stated Goals  To be able to return to her PT activities.     Currently in Pain?  No/denies    Pain Onset  More than a month ago          TREATMENT: Dry Needling: - performed with patient in sidelying (15 min) (3) 100mm x .40 mm placed along the lateral gluteal muscular along the right side to decrease increased spasms and pain with patient positioned in sidelying. Educated patients on benefits and risks of therapy and patient verbally consents to therapy.    Therapeutic Exercise Hip extension in prone with glute squeezes - x 10 on the L side Bridges performed x25 on the affected side   Manual Therapy: STM performed to patient's L side while positioned in sidelying along her glute med to  decrease increased pain and spasms utilizing superficial and deep techniques.  Active release technique performed to patient's affected side to decrease increased pain and spasms held for 1 min performed x3 with patient in sidelying raising leg to hip flexion and hip adduction   Patient demonstrates decreased pain at the end of the session    PT Education - 05/22/17 1016    Education provided  Yes    Education Details  form/technique with exercise: HEP: bridges in hooklying    Person(s) Educated  Patient    Methods  Explanation;Demonstration    Comprehension  Verbalized understanding;Returned demonstration          PT Long Term Goals - 05/22/17 1029      PT LONG TERM GOAL #1   Title  Patient will be able to run at least 3 miles with no increase in pain to return to ADLs.     Baseline  04/01/2017: Able to run 1.5 miles without pain during or the following day; 05/22/2017: able to run 3 miles without pain    Time  6    Period  Weeks    Status  Achieved      PT LONG TERM GOAL #2   Title  Patient will ruck at least 10 miles with no increase in  pain to return to duty related demands.     Baseline  Unable to ruck without increase in pain; 05/22/2017: increased pain with rucking 8 miles    Time  6    Period  Weeks    Status  On-going      PT LONG TERM GOAL #3   Title  Patient will be able to complete morning physical training sessions with no more than 1/10 pain to demonstrate improved tolerance for duty demands.     Baseline  04/01/16: Slight increase in pain 3/10 with performing physical performance measures.05/22/17: No pain the past 2 weeks other than today(8/10)    Time  8    Period  Weeks    Status  On-going            Plan - 05/22/17 1024    Clinical Impression Statement  Patient demonstrates increased pain and spasms today after a ruck march but was able to perform greter amount of running before today without onset of pain. Today was the first time patient had a  prolonged run or march over hilly terrain and increased pain with doing so indicating decreased glute stabilization with stepping exercise. Patient is making progress towards long term goals despite having increased pain today. Patient will benefit from further skilled therapy focused on improving limitations to return to prior level of function.     Rehab Potential  Excellent    PT Frequency  2x / week    PT Duration  4 weeks    PT Treatment/Interventions  Iontophoresis 4mg /ml Dexamethasone;Gait training;Balance training;Neuromuscular re-education;Dry needling;Manual techniques;Therapeutic activities;Therapeutic exercise;Electrical Stimulation;Cryotherapy;Ultrasound;Biofeedback;Moist Heat;Taping    PT Next Visit Plan  Continue to assess for SIJ pathology vs gluteal muscle loading issues.    PT Home Exercise Plan  Soft tissue mobilizations with tennis ball, isometric supine clamshells, single leg bridging, standing hip abductions and extensions     Consulted and Agree with Plan of Care  Patient       Patient will benefit from skilled therapeutic intervention in order to improve the following deficits and impairments:  Pain, Decreased strength, Decreased endurance, Decreased activity tolerance, Difficulty walking, Increased muscle spasms  Visit Diagnosis: Pain in left hip     Problem List There are no active problems to display for this patient.   Myrene Galas, PT DPT 05/22/2017, 10:31 AM  [place] [facility] SPORTS MEDICINE [address] [place], [address] Phone: [phone]   Fax:  [phone]  Name: Ashley Raymond MRN: [id number] Date of Birth: 08-Nov-1995

## 2017-05-27 ENCOUNTER — Ambulatory Visit

## 2017-05-27 DIAGNOSIS — M25552 Pain in left hip: Secondary | ICD-10-CM

## 2017-05-27 NOTE — Therapy (Signed)
[place] [facility] [address], [place], [address] Phone: [phone]   Fax:  [phone]  Physical Therapy Treatment  Patient Details  Name: Ashley Raymond MRN: [id number] Date of Birth: 1995-10-27 Referring Provider: Viviano Simas   Encounter Date: 05/27/2017  PT End of Session - 05/27/17 1240    Visit Number  18    Number of Visits  25    Date for PT Re-Evaluation  07/03/17    PT Start Time  0900    PT Stop Time  0945    PT Time Calculation (min)  45 min    Activity Tolerance  Patient tolerated treatment well    Behavior During Therapy  [facility] for tasks assessed/performed       History reviewed. No pertinent past medical history.  History reviewed. No pertinent surgical history.  There were no vitals filed for this visit.  Subjective Assessment - 05/27/17 1238    Subjective  Patient reports she has been feeling much better since the previous visit. Patient reports she has a physical test tomorrow and is trying to prepare.    Limitations  -- Running    Diagnostic tests  None at this point     Patient Stated Goals  To be able to return to her PT activities.     Currently in Pain?  No/denies    Pain Onset  More than a month ago       TREATMENT: Dry Needling: - performed with patient in sidelying (10 min) (2) x .40 mm placed along the lateral gluteal muscular along the right side to decrease increased spasms and pain with patient positioned in sidelying. Educated patients on benefits and risks of therapy and patient verbally consents to therapy.     Manual Therapy: STM performed to patient's L side while positioned in sidelying along her glute med to decrease increased pain and spasms utilizing superficial and deep techniques.  Active release technique performed to patient's affected side to decrease increased pain and spasms held for 1 min performed x3 with patient in sidelying raising leg to hip flexion  and hip adduction   Patient demonstrates decreased pain at the end of the session   PT Education - 05/27/17 1239    Education provided  Yes    Education Details  COntinue permforming mobility exercise to improve performance with PT test    Person(s) Educated  Patient    Methods  Explanation;Demonstration    Comprehension  Verbalized understanding;Returned demonstration          PT Long Term Goals - 05/22/17 1029      PT LONG TERM GOAL #1   Title  Patient will be able to run at least 3 miles with no increase in pain to return to ADLs.     Baseline  04/01/2017: Able to run 1.5 miles without pain during or the following day; 05/22/2017: able to run 3 miles without pain    Time  6    Period  Weeks    Status  Achieved      PT LONG TERM GOAL #2   Title  Patient will ruck at least 10 miles with no increase in pain to return to duty related demands.     Baseline  Unable to ruck without increase in pain; 05/22/2017: increased pain with rucking 8 miles    Time  6    Period  Weeks    Status  On-going  PT LONG TERM GOAL #3   Title  Patient will be able to complete morning physical training sessions with no more than 1/10 pain to demonstrate improved tolerance for duty demands.     Baseline  04/01/16: Slight increase in pain 3/10 with performing physical performance measures.05/22/17: No pain the past 2 weeks other than today(8/10)    Time  8    Period  Weeks    Status  On-going            Plan - 05/27/17 1241    Clinical Impression Statement  Patient demonstrates increased muscular spasms along the gluteal musculature on the affected side. Patient demonstrates decreased pain at the end of the session and reports the area feels looser compared to intitially coming into the therapy session. Patient will benefit from further skilled therapy to return to prior level of function.     Rehab Potential  Excellent    PT Frequency  2x / week    PT Duration  4 weeks    PT  Treatment/Interventions  Iontophoresis 4mg /ml Dexamethasone;Gait training;Balance training;Neuromuscular re-education;Dry needling;Manual techniques;Therapeutic activities;Therapeutic exercise;Electrical Stimulation;Cryotherapy;Ultrasound;Biofeedback;Moist Heat;Taping    PT Next Visit Plan  Continue to assess for SIJ pathology vs gluteal muscle loading issues.    PT Home Exercise Plan  Soft tissue mobilizations with tennis ball, isometric supine clamshells, single leg bridging, standing hip abductions and extensions     Consulted and Agree with Plan of Care  Patient       Patient will benefit from skilled therapeutic intervention in order to improve the following deficits and impairments:  Pain, Decreased strength, Decreased endurance, Decreased activity tolerance, Difficulty walking, Increased muscle spasms  Visit Diagnosis: Pain in left hip     Problem List There are no active problems to display for this patient.   Myrene GalasWesley Pau Banh, PT DPT 05/27/2017, 12:44 PM  [place] [facility] [address] [place], [place], [address] Phone: [phone]   Fax:  [phone]  Name: Ashley Raymond MRN: [id number] Date of Birth: 10/21/1995

## 2017-06-03 ENCOUNTER — Ambulatory Visit

## 2017-06-03 DIAGNOSIS — M25552 Pain in left hip: Secondary | ICD-10-CM

## 2017-06-03 NOTE — Therapy (Signed)
[place] [facility] [address] [place], [address] Phone: [phone]   Fax:  [phone]  Physical Therapy Treatment  Patient Details  Name: Ashley Raymond MRN: [id number] Date of Birth: 04/13/1995 Referring Provider: Viviano Simas   Encounter Date: 06/03/2017  PT End of Session - 06/03/17 1308    Visit Number  19    Number of Visits  25    Date for PT Re-Evaluation  07/03/17    PT Start Time  0900    PT Stop Time  0945    PT Time Calculation (min)  45 min    Activity Tolerance  Patient tolerated treatment well    Behavior During Therapy  [facility] for tasks assessed/performed       History reviewed. No pertinent past medical history.  History reviewed. No pertinent surgical history.  There were no vitals filed for this visit.  Subjective Assessment - 06/03/17 1037    Subjective  Patient reports she was able to pass her PT test with only slightly lower scores than previous tests. Patient states she had increased pain with performing stairs today.     Limitations  -- Running    Diagnostic tests  None at this point     Patient Stated Goals  To be able to return to her PT activities.     Currently in Pain?  No/denies    Pain Onset  More than a month ago         TREATMENT  Therapeutic Exercise: Ankle inversion; dorsiflexion against double RTB in sitting - x 20  Hip semi circles with sliders - x 20 B  Step ups onto BOSU ball - x 20 stopped secondary to pain Step ups onto 8" step - x 20  Ball roll outs in sitting - 4 min Running with focus on improving cadence with movement - x 2 min   Patient reports no increase in pain at the end of the session    PT Education - 06/03/17 1258    Education provided  Yes    Education Details  form/technique with exercise; cadence with running    Person(s) Educated  Patient    Methods  Explanation;Demonstration    Comprehension  Verbalized understanding;Returned  demonstration          PT Long Term Goals - 05/22/17 1029      PT LONG TERM GOAL #1   Title  Patient will be able to run at least 3 miles with no increase in pain to return to ADLs.     Baseline  04/01/2017: Able to run 1.5 miles without pain during or the following day; 05/22/2017: able to run 3 miles without pain    Time  6    Period  Weeks    Status  Achieved      PT LONG TERM GOAL #2   Title  Patient will ruck at least 10 miles with no increase in pain to return to duty related demands.     Baseline  Unable to ruck without increase in pain; 05/22/2017: increased pain with rucking 8 miles    Time  6    Period  Weeks    Status  On-going      PT LONG TERM GOAL #3   Title  Patient will be able to complete morning physical training sessions with no more than 1/10 pain to demonstrate improved tolerance for duty demands.     Baseline  04/01/16: Slight increase  in pain 3/10 with performing physical performance measures.05/22/17: No pain the past 2 weeks other than today(8/10)    Time  8    Period  Weeks    Status  On-going            Plan - 06/03/17 1308    Clinical Impression Statement  Patient demonstrates increased knee pain with performing step ups and focused on improving step up technique by engaging hip abduction and ER with movement. Patient demosntrates less pain after cueing to engage hip musculature with movement. Patient demonstrates improvement with movementwith less pain after cueing to engage hip musculature. Patient will benefit from further skilled therapy to return to prior level of function.     Rehab Potential  Excellent    PT Frequency  2x / week    PT Duration  4 weeks    PT Treatment/Interventions  Iontophoresis 4mg /ml Dexamethasone;Gait training;Balance training;Neuromuscular re-education;Dry needling;Manual techniques;Therapeutic activities;Therapeutic exercise;Electrical Stimulation;Cryotherapy;Ultrasound;Biofeedback;Moist Heat;Taping    PT Next Visit Plan   Continue to assess for SIJ pathology vs gluteal muscle loading issues.    PT Home Exercise Plan  Soft tissue mobilizations with tennis ball, isometric supine clamshells, single leg bridging, standing hip abductions and extensions     Consulted and Agree with Plan of Care  Patient       Patient will benefit from skilled therapeutic intervention in order to improve the following deficits and impairments:  Pain, Decreased strength, Decreased endurance, Decreased activity tolerance, Difficulty walking, Increased muscle spasms  Visit Diagnosis: Pain in left hip     Problem List There are no active problems to display for this patient.   Myrene GalasWesley Mayline Dragon, PT DPT 06/03/2017, 1:11 PM  [place] [facility] [address] [place], [place], [address] Phone: [phone]   Fax:  [phone]  Name: Ashley Raymond MRN: [id number] Date of Birth: 08/28/1995

## 2017-06-12 ENCOUNTER — Ambulatory Visit: Attending: Nurse Practitioner

## 2017-06-12 DIAGNOSIS — M25552 Pain in left hip: Secondary | ICD-10-CM

## 2017-06-12 NOTE — Therapy (Signed)
[place] [facility] [address] [place], [address] Phone: [phone]   Fax:  [phone]  Physical Therapy Treatment  Patient Details  Name: Ashley Raymond MRN: [id number] Date of Birth: 10-Jan-1996 Referring Provider: Viviano Simas   Encounter Date: 06/12/2017  PT End of Session - 06/12/17 1257    Visit Number  20    Number of Visits  25    Date for PT Re-Evaluation  07/03/17    PT Start Time  1115    PT Stop Time  1200    PT Time Calculation (min)  45 min    Activity Tolerance  Patient tolerated treatment well    Behavior During Therapy  [facility] for tasks assessed/performed       History reviewed. No pertinent past medical history.  History reviewed. No pertinent surgical history.  There were no vitals filed for this visit.  Subjective Assessment - 06/12/17 1254    Subjective  Patient reports she was able to work out for the past week and run without increase in pain. Patient states she went to hot yoga which the stretches aggravated her pain.     Limitations  -- Running    Diagnostic tests  None at this point     Patient Stated Goals  To be able to return to her PT activities.     Currently in Pain?  No/denies    Pain Onset  More than a month ago       TREATMENT: Dry Needling: - performed with patient in sidelying ( ) (2) x .40 mm placed along the lateral gluteal muscular along the right side to decrease increased spasms and pain with patient positioned in sidelying. Educated patients on benefits and risks of therapy and patient verbally consents to therapy.    Manual Therapy: STM performed to patient's L side while positioned in sidelying along her glute med to decrease increased pain and spasms utilizing superficial and deep techniques.  Side lying lumbar mobilizations  -- 2 x 30sec  Therapeutic Exercise: Walking at treadmill incline 10deg - 8 min  Running man - 2 x 10  Single leg DL - 2 x 10   Forward plank with hip circles - 2 x 10    Patient demonstrates decreased pain at the end of the session   PT Education - 06/12/17 1257    Education provided  Yes    Education Details  form/technique with exercise    Person(s) Educated  Patient    Methods  Explanation;Demonstration    Comprehension  Verbalized understanding;Returned demonstration          PT Long Term Goals - 05/22/17 1029      PT LONG TERM GOAL #1   Title  Patient will be able to run at least 3 miles with no increase in pain to return to ADLs.     Baseline  04/01/2017: Able to run 1.5 miles without pain during or the following day; 05/22/2017: able to run 3 miles without pain    Time  6    Period  Weeks    Status  Achieved      PT LONG TERM GOAL #2   Title  Patient will ruck at least 10 miles with no increase in pain to return to duty related demands.     Baseline  Unable to ruck without increase in pain; 05/22/2017: increased pain with rucking 8 miles    Time  6  Period  Weeks    Status  On-going      PT LONG TERM GOAL #3   Title  Patient will be able to complete morning physical training sessions with no more than 1/10 pain to demonstrate improved tolerance for duty demands.     Baseline  04/01/16: Slight increase in pain 3/10 with performing physical performance measures.05/22/17: No pain the past 2 weeks other than today(8/10)    Time  8    Period  Weeks    Status  On-going            Plan - 06/12/17 1336    Clinical Impression Statement  Patient demonstrates decreased hip pain and spasms after performing manual therapy and dry needling along her hip. Patient reports the pain is improving overall but continues to have difficulty with performing walking and patient will benefit from further skilled therapy to return to prior level of function.     Rehab Potential  Excellent    PT Frequency  2x / week    PT Duration  4 weeks    PT Treatment/Interventions  Iontophoresis /ml Dexamethasone;Gait  training;Balance training;Neuromuscular re-education;Dry needling;Manual techniques;Therapeutic activities;Therapeutic exercise;Electrical Stimulation;Cryotherapy;Ultrasound;Biofeedback;Moist Heat;Taping    PT Next Visit Plan  Continue to assess for SIJ pathology vs gluteal muscle loading issues.    PT Home Exercise Plan  Soft tissue mobilizations with tennis ball, isometric supine clamshells, single leg bridging, standing hip abductions and extensions     Consulted and Agree with Plan of Care  Patient       Patient will benefit from skilled therapeutic intervention in order to improve the following deficits and impairments:  Pain, Decreased strength, Decreased endurance, Decreased activity tolerance, Difficulty walking, Increased muscle spasms  Visit Diagnosis: Pain in left hip     Problem List There are no active problems to display for this patient.   Myrene Galas, PT DPT 06/12/2017, 1:43 PM  [facility] SPORTS MEDICINE [address], [place], [address] Phone: [phone]   Fax:  [phone]  Name: Ashley Raymond MRN: [id number] Date of Birth: 10-27-1995

## 2018-03-31 ENCOUNTER — Emergency Department

## 2018-03-31 ENCOUNTER — Encounter: Payer: Self-pay | Admitting: Emergency Medicine

## 2018-03-31 ENCOUNTER — Emergency Department
Admission: EM | Admit: 2018-03-31 | Discharge: 2018-03-31 | Disposition: A | Discharge status: Home / Self Care | Attending: Emergency Medicine | Admitting: Emergency Medicine

## 2018-03-31 ENCOUNTER — Other Ambulatory Visit: Payer: Self-pay

## 2018-03-31 DIAGNOSIS — Y998 Other external cause status: Secondary | ICD-10-CM | POA: Diagnosis not present

## 2018-03-31 DIAGNOSIS — Y929 Unspecified place or not applicable: Secondary | ICD-10-CM | POA: Diagnosis not present

## 2018-03-31 DIAGNOSIS — S3992XA Unspecified injury of lower back, initial encounter: Secondary | ICD-10-CM | POA: Diagnosis present

## 2018-03-31 DIAGNOSIS — S39012A Strain of muscle, fascia and tendon of lower back, initial encounter: Secondary | ICD-10-CM

## 2018-03-31 DIAGNOSIS — Y9301 Activity, walking, marching and hiking: Secondary | ICD-10-CM | POA: Diagnosis not present

## 2018-03-31 DIAGNOSIS — X503XXA Overexertion from repetitive movements, initial encounter: Secondary | ICD-10-CM | POA: Diagnosis not present

## 2018-03-31 LAB — POCT PREGNANCY, URINE: Preg Test, Ur: NEGATIVE

## 2018-03-31 MED ORDER — CYCLOBENZAPRINE HCL 10 MG PO TABS
10.0000 mg | ORAL_TABLET | Freq: Once | ORAL | Status: AC
  Administered 2018-03-31: 10 mg via ORAL
  Filled 2018-03-31: qty 1

## 2018-03-31 MED ORDER — LIDOCAINE 5 % EX PTCH: 1.0000 | MEDICATED_PATCH | Freq: Two times a day (BID) | CUTANEOUS | 0 refills | Status: AC

## 2018-03-31 MED ORDER — METHOCARBAMOL 750 MG PO TABS: 750.0000 mg | ORAL_TABLET | Freq: Four times a day (QID) | ORAL | 0 refills | Status: AC

## 2018-03-31 MED ORDER — METHYLPREDNISOLONE 4 MG PO TBPK: ORAL_TABLET | ORAL | 0 refills | Status: AC

## 2018-03-31 MED ORDER — LIDOCAINE 5 % EX PTCH
1.0000 | MEDICATED_PATCH | CUTANEOUS | Status: DC
  Administered 2018-03-31: 1 via TRANSDERMAL
  Filled 2018-03-31: qty 1

## 2018-03-31 NOTE — ED Provider Notes (Signed)
[facility] Emergency Department Provider Note   ____________________________________________   First MD Initiated Contact with Patient 03/31/18 [phone]     (approximate)  I have reviewed the triage vital signs and the nursing notes.   HISTORY  Chief Complaint Back Pain    HPI Ashley Raymond is a 23 y.o. female patient complain of radicular back pain to the left lower extremity secondary to physical training.  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  Patient states she is been experiencing increasing low back pain on the left side.  Patient had a similar incident a year ago again after prolonged road months.  Patient denies bladder bowel dysfunction.  Patient rates the pain as a 8/10.  Patient state pain increases with ambulation and standing.  No relief with over-the-counter anti-inflammatory medications.    History reviewed. No pertinent past medical history.  There are no active problems to display for this patient.   History reviewed. No pertinent surgical history.  Prior to Admission medications   Medication Sig Start Date End Date Taking? Authorizing Provider  lidocaine (LIDODERM) 5 % Place 1 patch onto the skin every 12 (twelve) hours. Remove & Discard patch within 12 hours or as directed by MD 03/31/18 03/31/19  Joni Reining, PA-C  methocarbamol (ROBAXIN-750) 750 MG tablet Take 1 tablet (750 mg total) by mouth 4 (four) times daily. 03/31/18   Joni Reining, PA-C  methylPREDNISolone (MEDROL DOSEPAK) 4 MG TBPK tablet Take Tapered dose as directed 03/31/18   Joni Reining, PA-C    Allergies Patient has no known allergies.  History reviewed. No pertinent family history.  Social History Social History   Tobacco Use  . Smoking status: Never Smoker  . Smokeless tobacco: Never Used  Substance Use Topics  . Alcohol use: Yes  . Drug use: No    Review of Systems Constitutional: No  fever/chills Eyes: No visual changes. ENT: No sore throat. Cardiovascular: Denies chest pain. Respiratory: Denies shortness of breath. Gastrointestinal: No abdominal pain.  No nausea, no vomiting.  No diarrhea.  No constipation. Genitourinary: Negative for dysuria. Musculoskeletal: Positive for back pain. Skin: Negative for rash. Neurological: Negative for headaches, focal weakness or numbness.   ____________________________________________   PHYSICAL EXAM:  VITAL SIGNS: ED Triage Vitals  Enc Vitals Group     BP 03/31/18 0558 (!) 116/92     Pulse Rate 03/31/18 0558 94     Resp 03/31/18 0558 18     Temp 03/31/18 0558 98 F (36.7 C)     Temp Source 03/31/18 0558 Oral     SpO2 03/31/18 0558 97 %     Weight 03/31/18 0559 146 lb (66.2 kg)     Height 03/31/18 0559 5\' 8"  (1.727 m)     Head Circumference --      Peak Flow --      Pain Score 03/31/18 0559 8     Pain Loc --      Pain Edu? --      Excl. in GC? --     Constitutional: Alert and oriented.  Moderate distress.   Neck: No stridor.  Hematological/Lymphatic/Immunilogical: No cervical lymphadenopathy. Cardiovascular: Normal rate, regular rhythm. Grossly normal heart sounds.  Good peripheral circulation. Respiratory: Normal respiratory effort.  No retractions. Lungs CTAB. Gastrointestinal: Soft and nontender. No distention. No abdominal bruits. No CVA tenderness. Musculoskeletal: No obvious spinal deformity.  Patient is moderate guarding palpation over  L3-4 through S1.  Patient had negative bilateral straight leg test.  Neurologic:  Normal speech and language. No gross focal neurologic deficits are appreciated. No gait instability. Skin:  Skin is warm, dry and intact. No rash noted. Psychiatric: Mood and affect are normal. Speech and behavior are normal.  ____________________________________________   LABS (all labs ordered are listed, but only abnormal results are displayed)  Labs Reviewed  POCT PREGNANCY, URINE    POC URINE PREG, ED   ____________________________________________  EKG   ____________________________________________  RADIOLOGY  ED MD interpretation:    Official radiology report(s): Dg Lumbar Spine 2-3 Views  Result Date: 03/31/2018 CLINICAL DATA:  Back pain worse with certain movements. Left more than right leg pain. EXAM: LUMBAR SPINE - 2-3 VIEW COMPARISON:  None FINDINGS: There are 5 lumbar type vertebral bodies and a lumbarized S1 segment. This numbering terminology assumes 12 rib-bearing thoracic vertebrae. There is mild thoracolumbar curvature convex to the left, measured at 6 degrees. No antero or retrolisthesis. No disc space narrowing. No visible pars defect. Question mild facet hypertrophy at L4-5 and L5-S1. Sacroiliac joints within normal limits. IMPRESSION: Mild thoracolumbar curvature convex to the left. Transitional anatomy with a lumbarized S1 segment. Possible mild facet hypertrophy at L4-5 and L5-S1. See above discussion. Electronically Signed   By: Paulina Fusi M.D.   On: 03/31/2018 08:37    ____________________________________________   PROCEDURES  Procedure(s) performed: None  Procedures  Critical Care performed: No  ____________________________________________   INITIAL IMPRESSION / ASSESSMENT AND PLAN / ED COURSE  As part of my medical decision making, I reviewed the following data within the electronic MEDICAL RECORD NUMBER     Low back pain with radicular component secondary to muscle strain.  Discussed x-ray findings with patient.  Patient given discharge care instructions advised take medication as directed.  Patient advised follow-up with the [facility] if condition persist.      ____________________________________________   FINAL CLINICAL IMPRESSION(S) / ED DIAGNOSES  Final diagnoses:  Strain of lumbar region, initial encounter     ED Discharge Orders         Ordered    methylPREDNISolone (MEDROL DOSEPAK) 4 MG TBPK tablet      03/31/18 0845    lidocaine (LIDODERM) 5 %  Every 12 hours     03/31/18 0845    methocarbamol (ROBAXIN-750) 750 MG tablet  4 times daily     03/31/18 0845           Note:  This document was prepared using Dragon voice recognition software and may include unintentional dictation errors.    Joni Reining, PA-C 03/31/18 0855    Nita Sickle, MD 03/31/18 1444

## 2018-03-31 NOTE — Discharge Instructions (Signed)
Follow discharge care instruction take medication as directed. °

## 2018-03-31 NOTE — ED Notes (Signed)
See triage note  Presents with lower back pain   States pain is mainly on the left and moves into left leg ambulates slowly to room d/t pain

## 2018-03-31 NOTE — ED Triage Notes (Signed)
Pt arrived tom the ED accompanied by her significant other from home for complaints of lower back pain secondary to injuring it while training. Pt is AOx4 in no apparent distress.

## 2019-04-02 IMAGING — CR DG LUMBAR SPINE 2-3V
3 series · 3 of 3 positions shown · non-contrast
Comparison: None

CLINICAL DATA: Back pain worse with certain movements. Left more
than right leg pain.

EXAM:
LUMBAR SPINE - 2-3 VIEW

[l-spine ap]
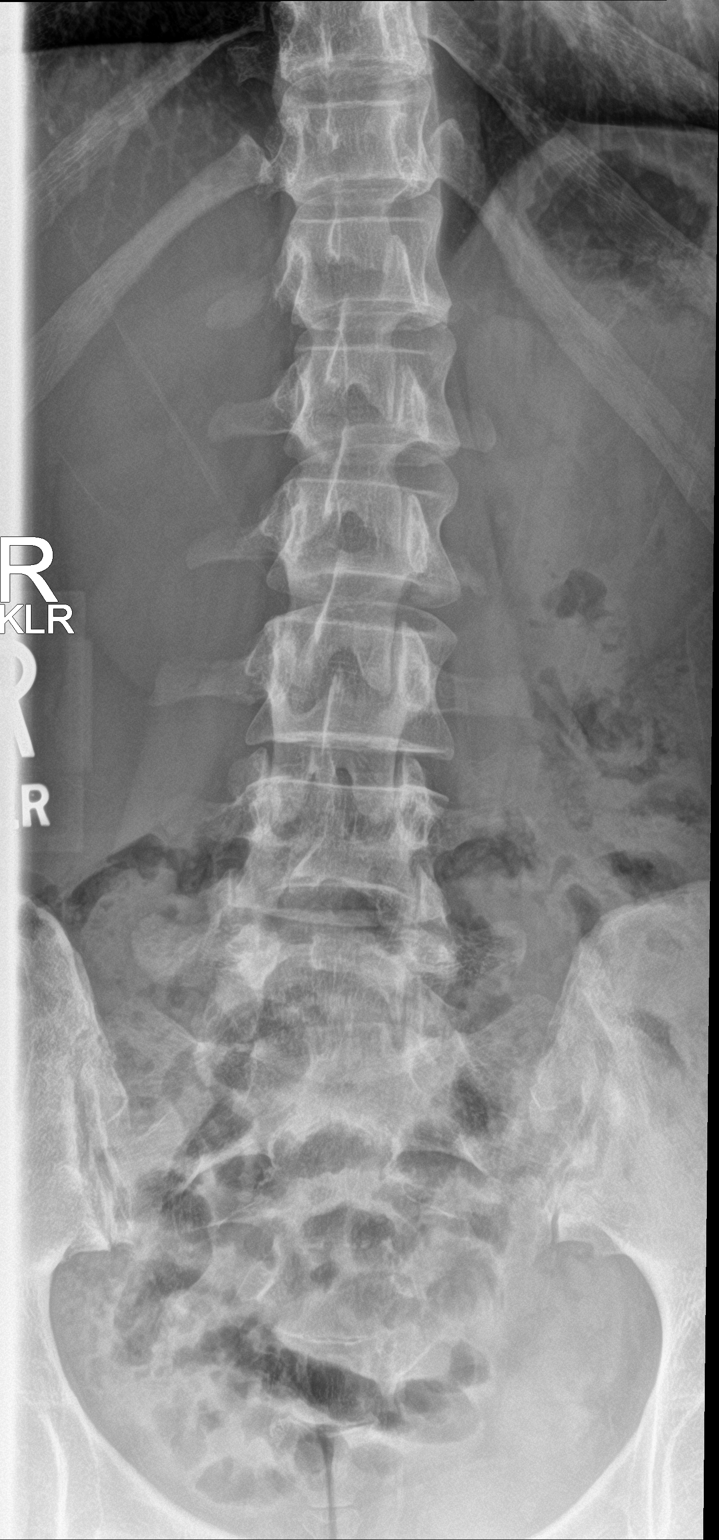

[l-spine lat]
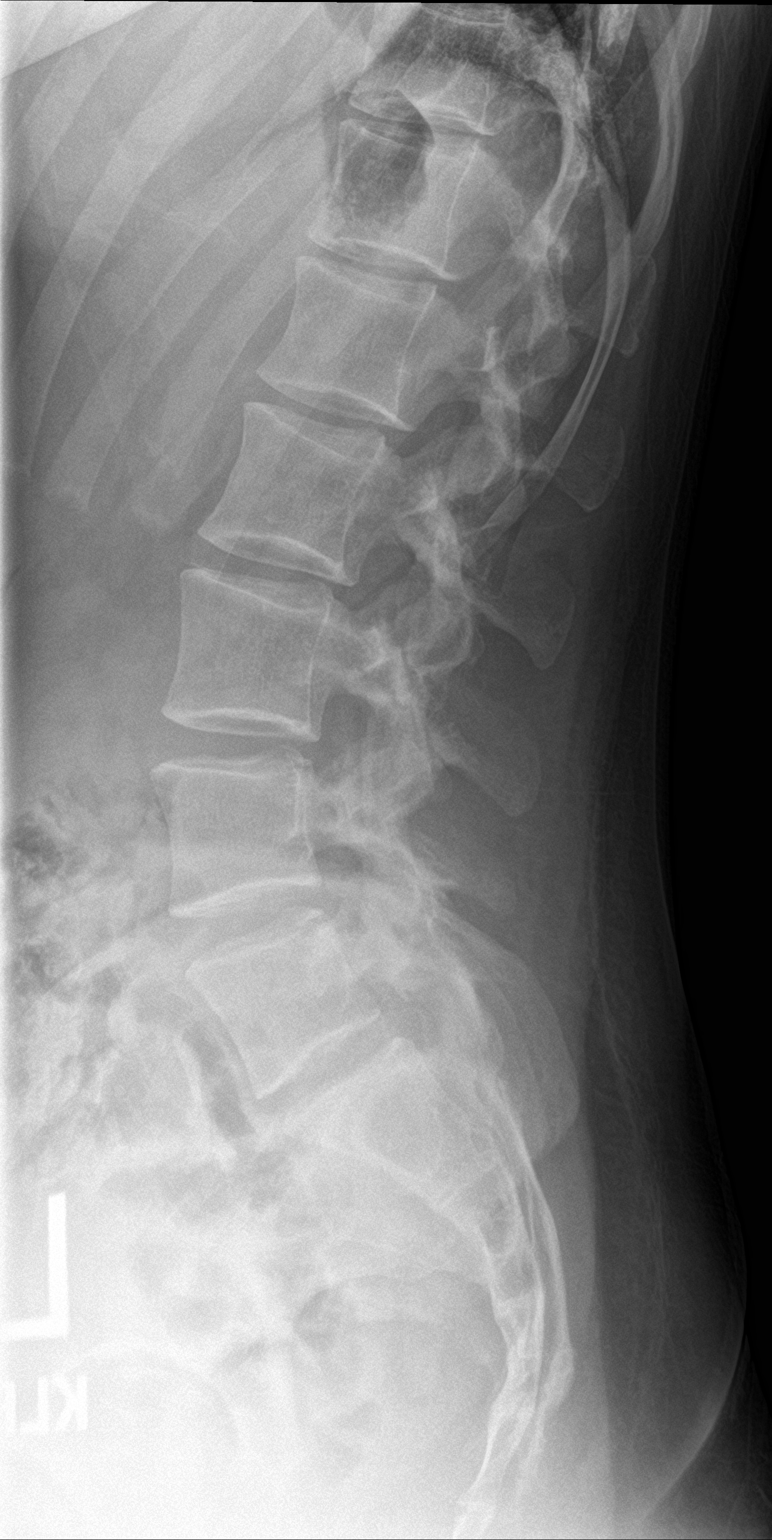

[l-spine spot]
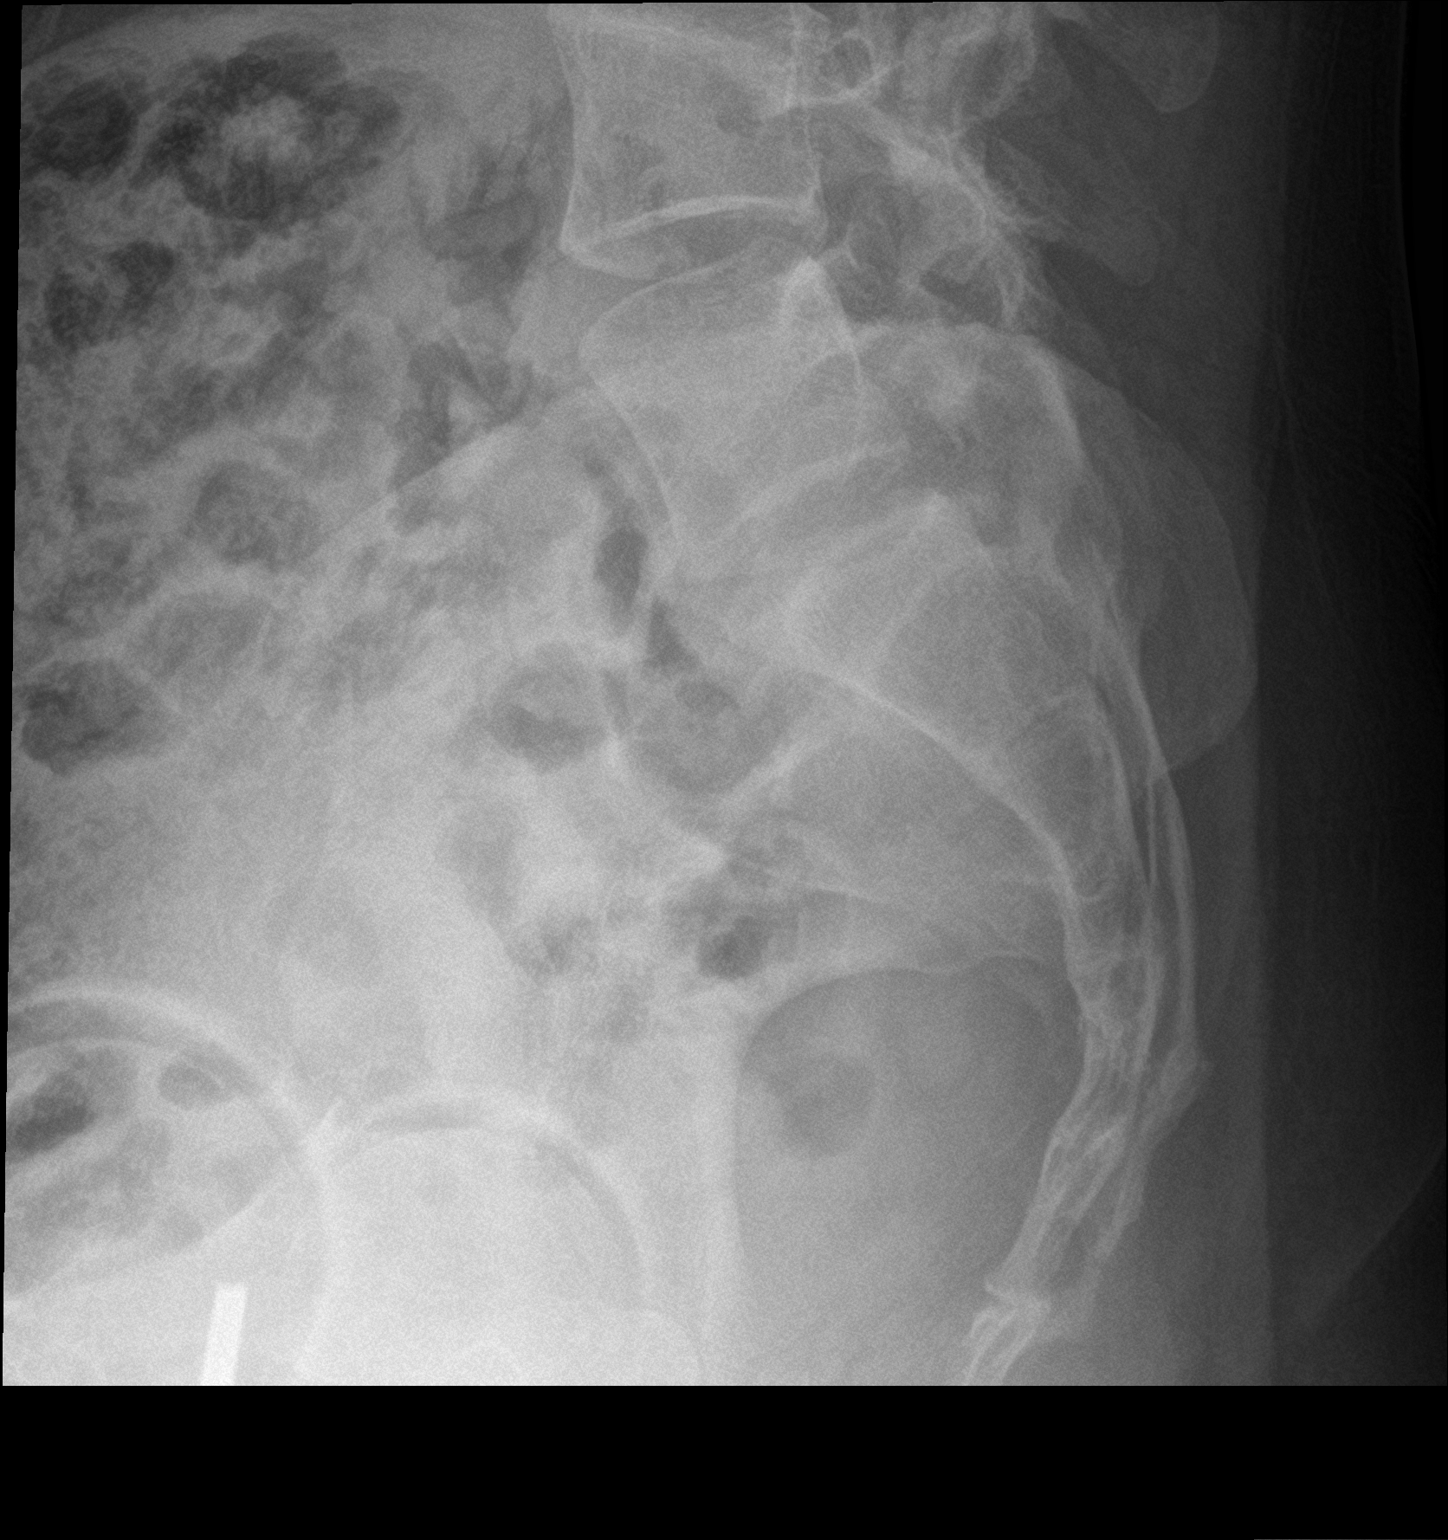

[3 of 3 positions shown; findings below may reference images not displayed]

FINDINGS: There are 5 lumbar type vertebral bodies and a lumbarized S1
segment. This numbering terminology assumes 12 rib-bearing thoracic
vertebrae. There is mild thoracolumbar curvature convex to the left,
measured at 6 degrees. No antero or retrolisthesis. No disc space
narrowing. No visible pars defect. Question mild facet hypertrophy
at L4-5 and L5-S1. Sacroiliac joints within normal limits.
IMPRESSION: Mild thoracolumbar curvature convex to the left. Transitional
anatomy with a lumbarized S1 segment. Possible mild facet
hypertrophy at L4-5 and L5-S1. See above discussion.
